# Patient Record
Sex: Male | Born: 1960
Health system: Southern US, Community
[De-identification: ages and names within clinical notes are randomized; demographics above are authoritative.]

## PROBLEM LIST (undated history)

## (undated) DIAGNOSIS — M1990 Unspecified osteoarthritis, unspecified site: Secondary | ICD-10-CM

## (undated) DIAGNOSIS — I10 Essential (primary) hypertension: Secondary | ICD-10-CM

## (undated) DIAGNOSIS — I739 Peripheral vascular disease, unspecified: Secondary | ICD-10-CM

## (undated) HISTORY — PX: MOUTH SURGERY: SHX715

## (undated) HISTORY — DX: Unspecified osteoarthritis, unspecified site: M19.90

---

## 2004-04-30 ENCOUNTER — Emergency Department: Payer: Self-pay | Admitting: Emergency Medicine

## 2008-05-17 ENCOUNTER — Emergency Department: Payer: Self-pay | Admitting: Emergency Medicine

## 2009-10-07 ENCOUNTER — Emergency Department: Payer: Self-pay | Admitting: Emergency Medicine

## 2016-08-08 DIAGNOSIS — F17200 Nicotine dependence, unspecified, uncomplicated: Secondary | ICD-10-CM | POA: Insufficient documentation

## 2016-08-08 DIAGNOSIS — Z0000 Encounter for general adult medical examination without abnormal findings: Secondary | ICD-10-CM | POA: Insufficient documentation

## 2016-10-08 ENCOUNTER — Ambulatory Visit (INDEPENDENT_AMBULATORY_CARE_PROVIDER_SITE_OTHER): Payer: BLUE CROSS/BLUE SHIELD | Admitting: Vascular Surgery

## 2016-10-08 ENCOUNTER — Encounter (INDEPENDENT_AMBULATORY_CARE_PROVIDER_SITE_OTHER): Payer: Self-pay | Admitting: Vascular Surgery

## 2016-10-08 VITALS — BP 138/80 | HR 71 | Resp 17 | Ht 67.0 in | Wt 127.2 lb

## 2016-10-08 DIAGNOSIS — F17200 Nicotine dependence, unspecified, uncomplicated: Secondary | ICD-10-CM

## 2016-10-08 DIAGNOSIS — I739 Peripheral vascular disease, unspecified: Secondary | ICD-10-CM | POA: Insufficient documentation

## 2016-10-08 DIAGNOSIS — M79604 Pain in right leg: Secondary | ICD-10-CM | POA: Diagnosis not present

## 2016-10-08 DIAGNOSIS — M79605 Pain in left leg: Secondary | ICD-10-CM | POA: Diagnosis not present

## 2016-10-08 NOTE — Progress Notes (Signed)
Subjective:    Patient ID: Kyle Gordon, male    DOB: Jun 05, 1960, 56 y.o.   MRN: [id number] Chief Complaint  Patient presents with  . Claudication   Presents at the request of Dr. Orland Jarredroxler for bilateral lower extremity claudication. Patient is experiencing cramping to his bilateral calf after long periods of activity. Patient states an interval shortening and his claudication distance. States most recently he has begun to experience rest pain which wakes him up at night. He denies any wound development to the lower extremity. Denies any fever, nausea or vomiting.    Review of Systems  Constitutional: Negative.   HENT: Negative.   Eyes: Negative.   Respiratory: Negative.   Cardiovascular:       Bilateral claudication  Gastrointestinal: Negative.   Endocrine: Negative.   Genitourinary: Negative.   Musculoskeletal: Negative.   Skin: Negative.   Allergic/Immunologic: Negative.   Neurological: Negative.   Hematological: Negative.   Psychiatric/Behavioral: Negative.       Objective:   Physical Exam  Constitutional: He is oriented to person, place, and time. He appears well-developed and well-nourished. No distress.  HENT:  Head: Normocephalic and atraumatic.  Right Ear: External ear normal.  Left Ear: External ear normal.  Eyes: Pupils are equal, round, and reactive to light. Conjunctivae are normal.  Neck: Normal range of motion.  Cardiovascular: Normal rate, regular rhythm, normal heart sounds and intact distal pulses.   Pulses:      Radial pulses are 2+ on the right side, and 2+ on the left side.       Dorsalis pedis pulses are 1+ on the right side, and 1+ on the left side.       Posterior tibial pulses are 1+ on the right side, and 1+ on the left side.  Pulmonary/Chest: Effort normal.  Musculoskeletal: Normal range of motion.  Neurological: He is alert and oriented to person, place, and time.  Skin: Skin is warm and dry. He is not diaphoretic.  Psychiatric: He has a  normal mood and affect. His behavior is normal. Judgment and thought content normal.  Vitals reviewed.  BP 138/80   Pulse 71   Resp 17   Ht 5\' 7"  (1.702 m)   Wt 127 lb 3.2 oz (57.7 kg)   BMI 19.92 kg/m   History reviewed. No pertinent past medical history.  Social History   Social History  . Marital status: Married    Spouse name: N/A  . Number of children: N/A  . Years of education: N/A   Occupational History  . Not on file.   Social History Main Topics  . Smoking status: Current Every Day Smoker  . Smokeless tobacco: Never Used  . Alcohol use Yes  . Drug use: Yes    Types: Marijuana  . Sexual activity: Not on file   Other Topics Concern  . Not on file   Social History Narrative  . No narrative on file    History reviewed. No pertinent surgical history.  Family History  Problem Relation Age of Onset  . Heart attack Father     No Known Allergies     Assessment & Plan:  Presents at the request of Dr. Orland Jarredroxler for bilateral lower extremity claudication. Patient is experiencing cramping to his bilateral calf after long periods of activity. Patient states an interval shortening and his claudication distance. States most recently he has begun to experience rest pain which wakes him up at night. He denies any wound development to  the lower extremity. Denies any fever, nausea or vomiting.   1. Bilateral lower extremity pain - New Patient presents with what sounds like claudication We'll bring patient back and obtain a stat ABI to rule out any peripheral artery disease that may be contributing to his discomfort  - VAS [place] ABI WITH/WO TBI; Future  2. Claudication of both lower extremities (HCC) - new As above - VAS [place] ABI WITH/WO TBI; Future  3. Tobacco use disorder - stable We had a discussion for approximately 10 minutes regarding the absolute need for smoking cessation due to the deleterious nature of tobacco on the vascular system. We discussed the tobacco use  would diminish patency of any intervention, and likely significantly worsen progressio of disease. We discussed multiple agents for quitting including replacement therapy or medications to reduce cravings such as Chantix. The patient voices their understanding of the importance of smoking cessation.  No current outpatient prescriptions on file prior to visit.   No current facility-administered medications on file prior to visit.     There are no Patient Instructions on file for this visit. No Follow-up on file.   Kazuma Elena A Datron Brakebill, PA-C

## 2016-10-17 ENCOUNTER — Encounter (INDEPENDENT_AMBULATORY_CARE_PROVIDER_SITE_OTHER): Payer: BLUE CROSS/BLUE SHIELD

## 2016-10-17 ENCOUNTER — Ambulatory Visit (INDEPENDENT_AMBULATORY_CARE_PROVIDER_SITE_OTHER): Payer: BLUE CROSS/BLUE SHIELD | Admitting: Vascular Surgery

## 2017-09-18 ENCOUNTER — Emergency Department
Admission: EM | Admit: 2017-09-18 | Discharge: 2017-09-18 | Disposition: A | Discharge status: Home / Self Care | Payer: Self-pay | Attending: Emergency Medicine | Admitting: Emergency Medicine

## 2017-09-18 ENCOUNTER — Emergency Department: Payer: Self-pay

## 2017-09-18 ENCOUNTER — Other Ambulatory Visit: Payer: Self-pay

## 2017-09-18 DIAGNOSIS — X509XXA Other and unspecified overexertion or strenuous movements or postures, initial encounter: Secondary | ICD-10-CM | POA: Insufficient documentation

## 2017-09-18 DIAGNOSIS — Y939 Activity, unspecified: Secondary | ICD-10-CM | POA: Insufficient documentation

## 2017-09-18 DIAGNOSIS — M79671 Pain in right foot: Secondary | ICD-10-CM | POA: Insufficient documentation

## 2017-09-18 DIAGNOSIS — Y999 Unspecified external cause status: Secondary | ICD-10-CM | POA: Insufficient documentation

## 2017-09-18 DIAGNOSIS — Y929 Unspecified place or not applicable: Secondary | ICD-10-CM | POA: Insufficient documentation

## 2017-09-18 DIAGNOSIS — F17210 Nicotine dependence, cigarettes, uncomplicated: Secondary | ICD-10-CM | POA: Insufficient documentation

## 2017-09-18 LAB — GLUCOSE, CAPILLARY: GLUCOSE-CAPILLARY: 98 mg/dL (ref 70–99)

## 2017-09-18 NOTE — ED Notes (Signed)
See triage note  States he is having pain to right foot  States pain is mainly across the tip of toes  And also states foot has been swelling Denies any injury

## 2017-09-18 NOTE — ED Provider Notes (Signed)
[facility] Emergency Department Provider Note  ____________________________________________  Time seen: Approximately 4:33 PM  I have reviewed the triage vital signs and the nursing notes.   HISTORY  Chief Complaint Foot Pain    HPI Kyle Gordon is a 57 y.o. male that presents to the emergency department for evaluation of right foot pain for 1 week. Pain is primarily across his toes. Top of his foot feels swollen.  He has not noticed any changing in colors.  He saw primary care last month for lower leg pain.  He was seeing vascular surgery for lower leg pain last year but has not followed up.  No trauma.  No numbness or tingling.   History reviewed. No pertinent past medical history.  Patient Active Problem List   Diagnosis Date Noted  . Bilateral lower extremity pain 10/08/2016  . Claudication of both lower extremities (HCC) 10/08/2016  . Tobacco use disorder 10/08/2016    History reviewed. No pertinent surgical history.  Prior to Admission medications   Medication Sig Start Date End Date Taking? Authorizing Provider  celecoxib (CELEBREX) 100 MG capsule Take 100 mg by mouth 2 (two) times daily.   Yes [provider]    Allergies Patient has no known allergies.  Family History  Problem Relation Age of Onset  . Heart attack Father     Social History Social History   Tobacco Use  . Smoking status: Current Every Day Smoker  . Smokeless tobacco: Never Used  Substance Use Topics  . Alcohol use: Yes  . Drug use: Yes    Types: Marijuana     Review of Systems  Constitutional: No fever/chills Musculoskeletal: Positive for foot pain. Skin: Negative for rash, abrasions, lacerations, ecchymosis. Neurological: Negative for numbness or tingling   ____________________________________________   PHYSICAL EXAM:  VITAL SIGNS: ED Triage Vitals  Enc Vitals Group     BP 09/18/17 1534 (!) 157/84     Pulse Rate 09/18/17 1534 81      Resp 09/18/17 1534 18     Temp 09/18/17 1534 98.5 F (36.9 C)     Temp Source 09/18/17 1534 Oral     SpO2 09/18/17 1534 99 %     Weight 09/18/17 1535 130 lb (59 kg)     Height 09/18/17 1535 5\' 7"  (1.702 m)     Head Circumference --      Peak Flow --      Pain Score 09/18/17 1534 8     Pain Loc --      Pain Edu? --      Excl. in GC? --      Constitutional: Alert and oriented. Well appearing and in no acute distress. Eyes: Conjunctivae are normal. PERRL. EOMI. Head: Atraumatic. ENT:      Ears:      Nose: No congestion/rhinnorhea.      Mouth/Throat: Mucous membranes are moist.  Neck: No stridor.   Cardiovascular: Normal rate, regular rhythm.  Good peripheral circulation.  Palpable dorsalis pedis pulses. Respiratory: Normal respiratory effort without tachypnea or retractions. Lungs CTAB. Good air entry to the bases with no decreased or absent breath sounds. Musculoskeletal: Full range of motion to all extremities. No gross deformities appreciated.  Mild dorsal foot swelling.  No erythema or ecchymosis.  No calf tenderness.  Tenderness to palpation of toes and dorsal foot.  Neurologic:  Normal speech and language. No gross focal neurologic deficits are appreciated.  Skin:  Skin is warm, dry and intact. No rash  noted. Psychiatric: Mood and affect are normal. Speech and behavior are normal. Patient exhibits appropriate insight and judgement.   ____________________________________________   LABS (all labs ordered are listed, but only abnormal results are displayed)  Labs Reviewed  GLUCOSE, CAPILLARY  CBG MONITORING, ED   ____________________________________________  EKG   ____________________________________________  RADIOLOGY Lexine BatonI, Malay Fantroy, personally viewed and evaluated these images (plain radiographs) as part of my medical decision making, as well as reviewing the written report by the radiologist.  [place] Venous Img Lower Unilateral Right  Result Date:  09/18/2017 CLINICAL DATA:  Right foot swelling and edema EXAM: RIGHT LOWER EXTREMITY VENOUS DOPPLER ULTRASOUND TECHNIQUE: Gray-scale sonography with graded compression, as well as color Doppler and duplex ultrasound were performed to evaluate the lower extremity deep venous systems from the level of the common femoral vein and including the common femoral, femoral, profunda femoral, popliteal and calf veins including the posterior tibial, peroneal and gastrocnemius veins when visible. The superficial great saphenous vein was also interrogated. Spectral Doppler was utilized to evaluate flow at rest and with distal augmentation maneuvers in the common femoral, femoral and popliteal veins. COMPARISON:  None. FINDINGS: Contralateral Common Femoral Vein: Respiratory phasicity is normal and symmetric with the symptomatic side. No evidence of thrombus. Normal compressibility. Common Femoral Vein: No evidence of thrombus. Normal compressibility, respiratory phasicity and response to augmentation. Saphenofemoral Junction: No evidence of thrombus. Normal compressibility and flow on color Doppler imaging. Profunda Femoral Vein: No evidence of thrombus. Normal compressibility and flow on color Doppler imaging. Femoral Vein: No evidence of thrombus. Normal compressibility, respiratory phasicity and response to augmentation. Popliteal Vein: No evidence of thrombus. Normal compressibility, respiratory phasicity and response to augmentation. Calf Veins: No evidence of thrombus. Normal compressibility and flow on color Doppler imaging. Superficial Great Saphenous Vein: No evidence of thrombus. Normal compressibility. Venous Reflux:  None. Other Findings:  None. IMPRESSION: No evidence of deep venous thrombosis. Electronically Signed   By: Judie PetitM.  Shick M.D.   On: 09/18/2017 17:02   Dg Foot Complete Right  Result Date: 09/18/2017 CLINICAL DATA:  Pain for 1 week EXAM: RIGHT FOOT COMPLETE - 3+ VIEW COMPARISON:  None. FINDINGS: Frontal,  oblique, and lateral views obtained. On the oblique view, there is a linear lucency which parallels the cortex in the proximal to mid portions of the first metatarsal. This appearance raises concern for incomplete fracture in this area. No other potential fracture evident. No dislocation. Joint spaces appear normal. No erosive change. IMPRESSION: Question incomplete fracture proximal to mid portions of the first metatarsal, seen only on the oblique view. Clinical assessment of this finding advised. No other findings elsewhere to suggest potential fracture. No dislocation. No appreciable arthropathic change. Electronically Signed   By: Bretta BangWilliam  Woodruff III M.D.   On: 09/18/2017 16:03    ____________________________________________    PROCEDURES  Procedure(s) performed:    Procedures    Medications - No data to display   ____________________________________________   INITIAL IMPRESSION / ASSESSMENT AND PLAN / ED COURSE  Pertinent labs & imaging results that were available during my care of the patient were reviewed by me and considered in my medical decision making (see chart for details).  Review of the [place] CSRS was performed in accordance of the [facility] prior to dispensing any controlled drugs.     Patient presented to the emergency department for evaluation of right foot pain for 1 week..  Vital signs and exam are reassuring.  X-ray concerning for possible fracture but patient does  not have any pain over this location.  Ultrasound negative for DVT.  Patient has palpable pulses.  He was seeing vascular surgery for lower extremity pain and claudication but has not followed up with them.   He was given a prescription for Celebrex from his primary care 1 month ago but has lost his insurance and cannot see them again. [facility].   Patient is to follow up with PCP and vascular surgery as directed. Patient is given ED precautions to return to the  ED for any worsening or new symptoms.     ____________________________________________  FINAL CLINICAL IMPRESSION(S) / ED DIAGNOSES  Final diagnoses:  Right foot pain      NEW MEDICATIONS STARTED DURING THIS VISIT:  ED Discharge Orders    None          This chart was dictated using voice recognition software/Dragon. Despite best efforts to proofread, errors can occur which can change the meaning. Any change was purely unintentional.    Enid DerryWagner, Clema Skousen, PA-C 09/18/17 1907    Minna AntisPaduchowski, Kevin, MD 09/18/17 2019

## 2017-09-18 NOTE — ED Triage Notes (Addendum)
Right foot pain X 1 week. No injury. Pt cannot elevate foot due to pain. Intermittent sharp and shooting pains through right foot.   Mild swelling to top or right foot starting at toes. No signs of infection or wound

## 2017-09-18 NOTE — Discharge Instructions (Addendum)
Please return to the emergency department if your pain gets worse, your foot is changing colors, or you're loosing feeling. [facility]. Follow up with vascular surgery for further testing.

## 2017-10-01 ENCOUNTER — Ambulatory Visit (INDEPENDENT_AMBULATORY_CARE_PROVIDER_SITE_OTHER): Payer: Self-pay | Admitting: Nurse Practitioner

## 2017-10-01 ENCOUNTER — Encounter (INDEPENDENT_AMBULATORY_CARE_PROVIDER_SITE_OTHER): Payer: Self-pay | Admitting: Nurse Practitioner

## 2017-10-01 ENCOUNTER — Encounter (INDEPENDENT_AMBULATORY_CARE_PROVIDER_SITE_OTHER): Payer: Self-pay

## 2017-10-01 VITALS — BP 184/98 | HR 84 | Resp 16 | Ht 67.0 in | Wt 123.6 lb

## 2017-10-01 DIAGNOSIS — I739 Peripheral vascular disease, unspecified: Secondary | ICD-10-CM

## 2017-10-01 DIAGNOSIS — F17200 Nicotine dependence, unspecified, uncomplicated: Secondary | ICD-10-CM

## 2017-10-01 DIAGNOSIS — L97909 Non-pressure chronic ulcer of unspecified part of unspecified lower leg with unspecified severity: Secondary | ICD-10-CM

## 2017-10-01 MED ORDER — TRAMADOL HCL 50 MG PO TABS: 50.0000 mg | ORAL_TABLET | Freq: Four times a day (QID) | ORAL | 0 refills | Status: DC | PRN

## 2017-10-01 NOTE — Progress Notes (Signed)
Subjective:    Patient ID: Kyle Gordon, male    DOB: [birth year], 57 y.o.   MRN: [id number] Chief Complaint  Patient presents with  . Follow-up    right foot pain    HPI  Kyle Gordon is a 57 y.o. male who presents with pain and swelling of his right lower extremity.  The patient has had claudication-like symptoms for over a year, when he was last seen in our office.  However, the patient was unable to follow-up and obtain ABIs.  The patient today presents with pain in his right lower extremity with ulceration of his second digit.  The patient has a 1 cm blister with intact skin.  The patient also describes rest pain symptoms, which consist of extreme pain while lying flat in bed with hanging of his foot off the bed to relieve the pain.  The patient states that his pain is consistent in his right lower extremity.  Patient states that he also has pain in his left lower extremity, however not to the same extent of the pain in his right lower extremity.  The patient presented to the emergency room on 09/18/2017, which he underwent a DVT study which was negative for DVTs.   Constitutional: [] Weight loss  [] Fever  [] Chills Cardiac: [] Chest pain   [] Chest pressure   [] Palpitations   [] Shortness of breath when laying flat   [] Shortness of breath with exertion. Vascular:  [x] Pain in legs with walking   [x] Pain in legs with standing  [] History of DVT   [] Phlebitis   [x] Swelling in legs   [] Varicose veins   [x] Non-healing ulcers Pulmonary:   [] Uses home oxygen   [] Productive cough   [] Hemoptysis   [] Wheeze  [] COPD   [] Asthma Neurologic:  [] Dizziness   [] Seizures   [] History of stroke   [] History of TIA  [] Aphasia   [] Vissual changes   [] Weakness or numbness in arm   [] Weakness or numbness in leg Musculoskeletal:   [] Joint swelling   [] Joint pain   [] Low back pain Hematologic:  [] Easy bruising  [] Easy bleeding   [] Hypercoagulable state   [] Anemic Gastrointestinal:  [] Diarrhea   [] Vomiting   [] Gastroesophageal reflux/heartburn   [] Difficulty swallowing. Genitourinary:  [] Chronic kidney disease   [] Difficult urination  [] Frequent urination   [] Blood in urine Skin:  [] Rashes   [] Ulcers  Psychological:  [] History of anxiety   []  History of major depression.     Objective:   Physical Exam  BP (!) 184/98 (BP Location: Right Arm)   Pulse 84   Resp 16   Ht 5\' 7"  (1.702 m)   Wt 123 lb 9.6 oz (56.1 kg)   BMI 19.36 kg/m   Past Medical History:  Diagnosis Date  . Arthritis      Gen: WD/WN, NAD Head: [place]/AT, No temporalis wasting.  Ear/Nose/Throat: Hearing grossly intact, nares w/o erythema or drainage Eyes: PER, EOMI, sclera nonicteric.  Neck: Supple, no masses.  No JVD.  Pulmonary:  Good air movement, no use of accessory muscles.  Cardiac: RRR Vascular:  1 cm ulceration on lateral edge of second digit Vessel Right Left  PT  not palpable   DP  not palpable   Gastrointestinal: soft, non-distended. No guarding/no peritoneal signs.  Musculoskeletal: M/S 5/5 throughout.  No deformity or atrophy.  Neurologic: Pain and light touch intact in extremities.  Symmetrical.  Speech is fluent. Motor exam as listed above. Psychiatric: Judgment intact, Mood & affect appropriate for pt's clinical situation. Dermatologic: No Venous  rashes. No Ulcers Noted.  No changes consistent with cellulitis. Lymph : No Cervical lymphadenopathy, no lichenification or skin changes of chronic lymphedema.   Social History   Socioeconomic History  . Marital status: Married    Spouse name: Not on file  . Number of children: Not on file  . Years of education: Not on file  . Highest education level: Not on file  Occupational History  . Not on file  Social Needs  . Financial resource strain: Not on file  . Food insecurity:    Worry: Not on file    Inability: Not on file  . Transportation needs:    Medical: Not on file    Non-medical: Not on file  Tobacco Use  . Smoking status: Current Every Day  Smoker  . Smokeless tobacco: Never Used  Substance and Sexual Activity  . Alcohol use: Yes  . Drug use: Yes    Types: Marijuana  . Sexual activity: Not on file  Lifestyle  . Physical activity:    Days per week: Not on file    Minutes per session: Not on file  . Stress: Not on file  Relationships  . Social connections:    Talks on phone: Not on file    Gets together: Not on file    Attends religious service: Not on file    Active member of club or organization: Not on file    Attends meetings of clubs or organizations: Not on file    Relationship status: Not on file  . Intimate partner violence:    Fear of current or ex partner: Not on file    Emotionally abused: Not on file    Physically abused: Not on file    Forced sexual activity: Not on file  Other Topics Concern  . Not on file  Social History Narrative  . Not on file    Past Surgical History:  Procedure Laterality Date  . MOUTH SURGERY      Family History  Problem Relation Age of Onset  . 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  This represents a limb threatening ischemia and places the patient at the risk for limb loss.  Patient should undergo angiography of the right lower extremities with the hope for intervention for limb salvage.  The risks and benefits as well as the alternative therapies was discussed in detail with the patient.  All questions were answered.  Patient agrees to proceed with angiography.  The patient will follow up with me in the office after the procedure.    2. Claudication of both lower extremities (HCC) The patient will follow-up in the office following his angiogram.  We will perform ABIs to determine if he will also need an  angiogram on his left lower extremity.  3. Tobacco use disorder The patient was counseled to consider smoking cessation.  Patient was counseled as to the pathophysiology of atherosclerosis and peripheral vascular disease and it was noted that tobacco use is a major contribution to these diseases. Current Outpatient Medications on File Prior to Visit  Medication Sig Dispense Refill  . celecoxib (CELEBREX) 100 MG capsule Take 100 mg by mouth 2 (two) times daily.     No current  facility-administered medications on file prior to visit.     There are no Patient Instructions on file for this visit. No follow-ups on file.   Georgiana Spinner, NP

## 2017-10-03 ENCOUNTER — Encounter (INDEPENDENT_AMBULATORY_CARE_PROVIDER_SITE_OTHER): Payer: Self-pay

## 2017-10-04 ENCOUNTER — Other Ambulatory Visit (INDEPENDENT_AMBULATORY_CARE_PROVIDER_SITE_OTHER): Payer: Self-pay | Admitting: Nurse Practitioner

## 2017-10-11 ENCOUNTER — Inpatient Hospital Stay: Admission: RE | Admit: 2017-10-11 | Payer: Self-pay | Source: Ambulatory Visit

## 2017-10-13 MED ORDER — DEXTROSE 5 % IV SOLN
2.0000 g | Freq: Once | INTRAVENOUS | Status: AC
  Administered 2017-10-14: 2 g via INTRAVENOUS
  Filled 2017-10-13: qty 20

## 2017-10-14 ENCOUNTER — Ambulatory Visit: Payer: Self-pay

## 2017-10-14 ENCOUNTER — Encounter: Payer: Self-pay | Admitting: Emergency Medicine

## 2017-10-14 ENCOUNTER — Ambulatory Visit
Admission: RE | Admit: 2017-10-14 | Discharge: 2017-10-14 | Disposition: A | Discharge status: Home / Self Care | Payer: Self-pay | Source: Ambulatory Visit | Attending: Vascular Surgery | Admitting: Vascular Surgery

## 2017-10-14 ENCOUNTER — Encounter
Admission: RE | Disposition: A | Discharge status: Home / Self Care | Payer: Self-pay | Source: Ambulatory Visit | Attending: Vascular Surgery

## 2017-10-14 DIAGNOSIS — I70235 Atherosclerosis of native arteries of right leg with ulceration of other part of foot: Secondary | ICD-10-CM | POA: Insufficient documentation

## 2017-10-14 DIAGNOSIS — L97519 Non-pressure chronic ulcer of other part of right foot with unspecified severity: Secondary | ICD-10-CM | POA: Insufficient documentation

## 2017-10-14 DIAGNOSIS — I70213 Atherosclerosis of native arteries of extremities with intermittent claudication, bilateral legs: Secondary | ICD-10-CM

## 2017-10-14 DIAGNOSIS — L97909 Non-pressure chronic ulcer of unspecified part of unspecified lower leg with unspecified severity: Secondary | ICD-10-CM

## 2017-10-14 DIAGNOSIS — I70299 Other atherosclerosis of native arteries of extremities, unspecified extremity: Secondary | ICD-10-CM

## 2017-10-14 DIAGNOSIS — F17210 Nicotine dependence, cigarettes, uncomplicated: Secondary | ICD-10-CM | POA: Insufficient documentation

## 2017-10-14 DIAGNOSIS — I70212 Atherosclerosis of native arteries of extremities with intermittent claudication, left leg: Secondary | ICD-10-CM | POA: Insufficient documentation

## 2017-10-14 HISTORY — PX: LOWER EXTREMITY ANGIOGRAPHY: CATH118251

## 2017-10-14 LAB — CREATININE, SERUM
Creatinine, Ser: 0.8 mg/dL (ref 0.61–1.24)
GFR calc Af Amer: >60 mL/min (ref >60)

## 2017-10-14 LAB — BUN: BUN: 6 mg/dL (ref 6–20)

## 2017-10-14 SURGERY — LOWER EXTREMITY ANGIOGRAPHY
Anesthesia: Moderate Sedation | Laterality: Right

## 2017-10-14 MED ORDER — IOPAMIDOL (ISOVUE-300) INJECTION 61%
INTRAVENOUS | Status: DC | PRN
  Administered 2017-10-14: 30 mL via INTRAVENOUS

## 2017-10-14 MED ORDER — LIDOCAINE-EPINEPHRINE (PF) 1 %-1:200000 IJ SOLN
INTRAMUSCULAR | Status: AC
  Filled 2017-10-14: qty 30

## 2017-10-14 MED ORDER — HYDROMORPHONE HCL 1 MG/ML IJ SOLN
1.0000 mg | Freq: Once | INTRAMUSCULAR | Status: AC | PRN
  Administered 2017-10-14: 1 mg via INTRAVENOUS

## 2017-10-14 MED ORDER — SODIUM CHLORIDE 0.9 % IV SOLN: INTRAVENOUS | Status: AC

## 2017-10-14 MED ORDER — HYDRALAZINE HCL 20 MG/ML IJ SOLN: 5.0000 mg | INTRAMUSCULAR | Status: DC | PRN

## 2017-10-14 MED ORDER — HEPARIN (PORCINE) IN NACL 1000-0.9 UT/500ML-% IV SOLN
INTRAVENOUS | Status: AC
  Filled 2017-10-14: qty 1000

## 2017-10-14 MED ORDER — SODIUM CHLORIDE 0.9 % IV SOLN
INTRAVENOUS | Status: DC
  Administered 2017-10-14: 08:00:00 via INTRAVENOUS

## 2017-10-14 MED ORDER — LABETALOL HCL 5 MG/ML IV SOLN
INTRAVENOUS | Status: AC
  Filled 2017-10-14: qty 4

## 2017-10-14 MED ORDER — ONDANSETRON HCL 4 MG/2ML IJ SOLN: 4.0000 mg | Freq: Four times a day (QID) | INTRAMUSCULAR | Status: DC | PRN

## 2017-10-14 MED ORDER — SODIUM CHLORIDE 0.9% FLUSH: 3.0000 mL | INTRAVENOUS | Status: DC | PRN

## 2017-10-14 MED ORDER — HYDROMORPHONE HCL 1 MG/ML IJ SOLN
INTRAMUSCULAR | Status: AC
  Filled 2017-10-14: qty 1

## 2017-10-14 MED ORDER — MIDAZOLAM HCL 2 MG/2ML IJ SOLN
INTRAMUSCULAR | Status: DC | PRN
  Administered 2017-10-14: 2 mg via INTRAVENOUS

## 2017-10-14 MED ORDER — FENTANYL CITRATE (PF) 100 MCG/2ML IJ SOLN
INTRAMUSCULAR | Status: AC
  Filled 2017-10-14: qty 2

## 2017-10-14 MED ORDER — MIDAZOLAM HCL 5 MG/5ML IJ SOLN
INTRAMUSCULAR | Status: AC
  Filled 2017-10-14: qty 5

## 2017-10-14 MED ORDER — SODIUM CHLORIDE 0.9% FLUSH: 3.0000 mL | Freq: Two times a day (BID) | INTRAVENOUS | Status: DC

## 2017-10-14 MED ORDER — ACETAMINOPHEN 325 MG PO TABS: 650.0000 mg | ORAL_TABLET | ORAL | Status: DC | PRN

## 2017-10-14 MED ORDER — FENTANYL CITRATE (PF) 100 MCG/2ML IJ SOLN
INTRAMUSCULAR | Status: DC | PRN
  Administered 2017-10-14: 50 ug via INTRAVENOUS

## 2017-10-14 MED ORDER — SODIUM CHLORIDE 0.9 % IV SOLN: 250.0000 mL | INTRAVENOUS | Status: DC | PRN

## 2017-10-14 MED ORDER — HEPARIN SODIUM (PORCINE) 1000 UNIT/ML IJ SOLN
INTRAMUSCULAR | Status: AC
  Filled 2017-10-14: qty 1

## 2017-10-14 MED ORDER — LABETALOL HCL 5 MG/ML IV SOLN
10.0000 mg | INTRAVENOUS | Status: DC | PRN
  Administered 2017-10-14 (×2): 10 mg via INTRAVENOUS

## 2017-10-14 MED ORDER — IOPAMIDOL (ISOVUE-370) INJECTION 76%
85.0000 mL | Freq: Once | INTRAVENOUS | Status: AC | PRN
  Administered 2017-10-14: 85 mL via INTRAVENOUS

## 2017-10-14 SURGICAL SUPPLY — 8 items
CANNULA 5F STIFF (CANNULA) ×6 IMPLANT
CATH PIG 70CM (CATHETERS) ×3 IMPLANT
COVER PROBE U/S 5X48 (MISCELLANEOUS) ×3 IMPLANT
PACK ANGIOGRAPHY (CUSTOM PROCEDURE TRAY) ×3 IMPLANT
SHEATH BRITE TIP 5FRX11 (SHEATH) ×3 IMPLANT
TOWEL OR 17X26 4PK STRL BLUE (TOWEL DISPOSABLE) ×3 IMPLANT
TUBING CONTRAST HIGH PRESS 72 (TUBING) ×3 IMPLANT
WIRE J 3MM .035X145CM (WIRE) ×3 IMPLANT

## 2017-10-14 NOTE — Discharge Instructions (Signed)
Femoral Site Care °Refer to this sheet in the next few weeks. These instructions provide you with information about caring for yourself after your procedure. Your health care provider may also give you more specific instructions. Your treatment has been planned according to current medical practices, but problems sometimes occur. Call your health care provider if you have any problems or questions after your procedure. °What can I expect after the procedure? °After your procedure, it is typical to have the following: °· Bruising at the site that usually fades within 1-2 weeks. °· Blood collecting in the tissue (hematoma) that may be painful to the touch. It should usually decrease in size and tenderness within 1-2 weeks. ° °Follow these instructions at home: °· Take medicines only as directed by your health care provider. °· You may shower 24-48 hours after the procedure or as directed by your health care provider. Remove the bandage (dressing) and gently wash the site with plain soap and water. Pat the area dry with a clean towel. Do not rub the site, because this may cause bleeding. °· Do not take baths, swim, or use a hot tub until your health care provider approves. °· Check your insertion site every day for redness, swelling, or drainage. °· Do not apply powder or lotion to the site. °· Limit use of stairs to twice a day for the first 2-3 days or as directed by your health care provider. °· Do not squat for the first 2-3 days or as directed by your health care provider. °· Do not lift over 10 lb (4.5 kg) for 5 days after your procedure or as directed by your health care provider. °· Ask your health care provider when it is okay to: °? Return to work or school. °? Resume usual physical activities or sports. °? Resume sexual activity. °· Do not drive home if you are discharged the same day as the procedure. Have someone else drive you. °· You may drive 24 hours after the procedure unless otherwise instructed by  your health care provider. °· Do not operate machinery or power tools for 24 hours after the procedure or as directed by your health care provider. °· If your procedure was done as an outpatient procedure, which means that you went home the same day as your procedure, a responsible adult should be with you for the first 24 hours after you arrive home. °· Keep all follow-up visits as directed by your health care provider. This is important. °Contact a health care provider if: °· You have a fever. °· You have chills. °· You have increased bleeding from the site. Hold pressure on the site. °Get help right away if: °· You have unusual pain at the site. °· You have redness, warmth, or swelling at the site. °· You have drainage (other than a small amount of blood on the dressing) from the site. °· The site is bleeding, and the bleeding does not stop after 30 minutes of holding steady pressure on the site. °· Your leg or foot becomes pale, cool, tingly, or numb. °This information is not intended to replace advice given to you by your health care provider. Make sure you discuss any questions you have with your health care provider. °Document Released: 09/18/2013 Document Revised: 06/23/2015 Document Reviewed: 08/04/2013 °Elsevier Interactive Patient Education © 2018 [employer] °Moderate Conscious Sedation, Adult, Care After °These instructions provide you with information about caring for yourself after your procedure. Your health care provider may also give you more   specific instructions. Your treatment has been planned according to current medical practices, but problems sometimes occur. Call your health care provider if you have any problems or questions after your procedure. °What can I expect after the procedure? °After your procedure, it is common: °· To feel sleepy for several hours. °· To feel clumsy and have poor balance for several hours. °· To have poor judgment for several hours. °· To vomit if you eat too  soon. ° °Follow these instructions at home: °For at least 24 hours after the procedure: ° °· Do not: °? Participate in activities where you could fall or become injured. °? Drive. °? Use heavy machinery. °? Drink alcohol. °? Take sleeping pills or medicines that cause drowsiness. °? Make important decisions or sign legal documents. °? Take care of children on your own. °· Rest. °Eating and drinking °· Follow the diet recommended by your health care provider. °· If you vomit: °? Drink water, juice, or soup when you can drink without vomiting. °? Make sure you have little or no nausea before eating solid foods. °General instructions °· Have a responsible adult stay with you until you are awake and alert. °· Take over-the-counter and prescription medicines only as told by your health care provider. °· If you smoke, do not smoke without supervision. °· Keep all follow-up visits as told by your health care provider. This is important. °Contact a health care provider if: °· You keep feeling nauseous or you keep vomiting. °· You feel light-headed. °· You develop a rash. °· You have a fever. °Get help right away if: °· You have trouble breathing. °This information is not intended to replace advice given to you by your health care provider. Make sure you discuss any questions you have with your health care provider. °Document Released: 11/05/2012 Document Revised: 06/20/2015 Document Reviewed: 05/07/2015 °Elsevier Interactive Patient Education © 2018 [employer] ° °

## 2017-10-14 NOTE — H&P (Signed)
[facility] History & Physical Update  The patient was interviewed and re-examined.  The patient's previous History and Physical has been reviewed and is unchanged.  There is no change in the plan of care. We plan to proceed with the scheduled procedure.  Festus BarrenJason Dew, MD  10/14/2017, 8:06 AM

## 2017-10-14 NOTE — Op Note (Signed)
[facility]  Percutaneous Study/Intervention Procedural Note   Date of Surgery: 10/14/2017  Surgeon(s):DEW,JASON    Assistants:none  Pre-operative Diagnosis: PAD with ulceration right lower extremity, pain bilateral lower extremities  Post-operative diagnosis:  Same  Procedure(s) Performed:             1.  Ultrasound guidance for vascular access bilateral femoral arteries             2.   Imaging through the femoral micropuncture sheaths bilaterally to evaluate the aortoiliac segment and bilateral lower extremity angiograms  EBL: 10 cc  Contrast: 30 cc  Fluoro Time: 0.6 minutes  Moderate Conscious Sedation Time: approximately 15 minutes using 2 mg of Versed and 50 Mcg of Fentanyl              Indications:  Patient is a 57 y.o.male with nonhealing ulceration on the right foot and significant pain bilaterally with no palpable pedal pulses. The patient is brought in for angiography for further evaluation and potential treatment.  Due to the limb threatening nature of the situation, angiogram was performed for attempted limb salvage. The patient is aware that if the procedure fails, amputation would be expected.  The patient also understands that even with successful revascularization, amputation may still be required due to the severity of the situation. Risks and benefits are discussed and informed consent is obtained.   Procedure:  The patient was identified and appropriate procedural time out was performed.  The patient was then placed supine on the table and prepped and draped in the usual sterile fashion. Moderate conscious sedation was administered during a face to face encounter with the patient throughout the procedure with my supervision of the RN administering medicines and monitoring the patient's vital signs, pulse oximetry, telemetry and mental status throughout from the start of the procedure until the patient was taken to the recovery room. Ultrasound  was used to evaluate the left common femoral artery.  It was patent but diseased.  A digital ultrasound image was acquired.  A Seldinger needle was used to access the left common femoral artery under direct ultrasound guidance and a permanent image was performed.   The flow was extremely sluggish and the J-wire would not pass.  A micropuncture wire went to the mid iliacs and I then placed a micropuncture sheath in the left femoral artery.  Imaging showed occlusion of the iliac vessels from the external iliac up.  The aorta was highly calcific but no flow could be demonstrated into the aorta through this injection.  At this point, I went ahead and access the right femoral artery to try to see what the anatomy was there as well.  Again, a calcific and diseased right common femoral artery was visualized with ultrasound.  It was accessed with a micropuncture needle and a micropuncture wire and sheath were placed but this would not pass up to its tip.  Imaging through the right femoral sheath showed the right iliac system to be occluded as well with the external iliac artery being occluded down to the mid to distal segment.  Again, no flow could be identified in the aorta.  I elected to go ahead and image both lower extremities through the femoral sheaths to understand the infrainguinal disease as well and determine what further reconstruction would be performed.  The left leg was imaged to the left femoral sheath.  This demonstrated calcified disease of the common femoral artery with a flush SFA occlusion.  Again,  there appeared to be some calcific disease in the profunda femoris artery but no high-grade stenosis.  The SFA reconstituted the popliteal artery around the knee.  Again, there was some tibial disease that was difficult to opacify due to the poor inflow.  There appeared to be an anterior tibial artery with some degree of stenosis or occlusion although this was the best runoff distally.  A peroneal artery  appeared to be present as well.  I then image the right leg through the right micropuncture sheath.  This demonstrated calcified disease of the common femoral artery with a flush SFA occlusion.  The profunda femoris artery was calcified but did not appear highly stenotic.  The SFA reconstituted the above-knee popliteal artery.  There was then what appeared to be two-vessel runoff but disease and near occlusive stenosis in the anterior tibial artery which was 1 of the vessels.  The posterior tibial artery also appeared to be somewhat diseased but it was difficult to discern the degree of stenosis due to the poor inflow.  Both sheaths were removed and pressure was held in the groin bilaterally.  The patient was taken to the recovery room in stable condition having tolerated the procedure well.  A CT scan of the abdomen and pelvis will be performed to further evaluate his aorta and determine reconstruction options.  Findings:               Aortogram:  Occlusion of the iliac arteries precluded good evaluation of the aorta and renal arteries although a suspected occlusion of the aorta was present             Right lower Extremity:  Calcified disease of the common femoral artery with a flush SFA occlusion.  The profunda femoris artery was calcified but did not appear highly stenotic.  The SFA reconstituted the above-knee popliteal artery.  There was then what appeared to be two-vessel runoff but disease and near occlusive stenosis in the anterior tibial artery which was 1 of the vessels.  The posterior tibial artery also appeared to be somewhat diseased but it was difficult to discern the degree of stenosis due to the poor inflow  Left Lower Extremity: Calcified disease of the common femoral artery with a flush SFA occlusion.  Again, there appeared to be some calcific disease in the profunda femoris artery but no high-grade stenosis.  The SFA reconstituted the popliteal artery around the knee.  Again, there was some  tibial disease that was difficult to opacify due to the poor inflow.  There appeared to be an anterior tibial artery with some degree of stenosis or occlusion although this was the best runoff distally.  A peroneal artery appeared to be present as well   Disposition: Patient was taken to the recovery room in stable condition having tolerated the procedure well.  Complications: None  Festus Barren 10/14/2017 9:22 AM   This note was created with Dragon Medical transcription system. Any errors in dictation are purely unintentional.

## 2017-10-15 ENCOUNTER — Encounter (INDEPENDENT_AMBULATORY_CARE_PROVIDER_SITE_OTHER): Payer: Self-pay

## 2017-10-29 ENCOUNTER — Encounter (INDEPENDENT_AMBULATORY_CARE_PROVIDER_SITE_OTHER): Payer: Self-pay | Admitting: Vascular Surgery

## 2017-10-29 ENCOUNTER — Ambulatory Visit (INDEPENDENT_AMBULATORY_CARE_PROVIDER_SITE_OTHER): Payer: Self-pay | Admitting: Vascular Surgery

## 2017-10-29 VITALS — BP 185/96 | HR 88 | Resp 17 | Ht 67.0 in | Wt 119.6 lb

## 2017-10-29 DIAGNOSIS — L97519 Non-pressure chronic ulcer of other part of right foot with unspecified severity: Secondary | ICD-10-CM

## 2017-10-29 DIAGNOSIS — I7025 Atherosclerosis of native arteries of other extremities with ulceration: Secondary | ICD-10-CM

## 2017-10-29 DIAGNOSIS — R030 Elevated blood-pressure reading, without diagnosis of hypertension: Secondary | ICD-10-CM | POA: Insufficient documentation

## 2017-10-29 DIAGNOSIS — F17200 Nicotine dependence, unspecified, uncomplicated: Secondary | ICD-10-CM

## 2017-10-29 DIAGNOSIS — F17210 Nicotine dependence, cigarettes, uncomplicated: Secondary | ICD-10-CM

## 2017-10-29 NOTE — Patient Instructions (Signed)

## 2017-10-29 NOTE — Assessment & Plan Note (Signed)
The patient has a critical and limb threatening situation.  He has rest pain bilaterally and an ulceration on the right foot. He has subsequently undergone a CT angiogram of the abdomen and pelvis which I have independently reviewed.  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  The iliacs are occluded and reconstituted in the proximal common femoral arteries which were also diseased.  He has SFA occlusions bilaterally. This is a very severe and worrisome problem.  He is going to need a major arterial reconstruction in the form of an aortobifemoral bypass likely with a concomitant aortic endarterectomy as he has significant plaque all the way up to the renal arteries.  Bilateral femoral endarterectomies are also going to be required as he has femoral disease as well.  On the right, with ulceration and an SFA occlusion we could either add a femoral to popliteal bypass or try to recanalize his SFA with stents which would be a less invasive option on an already major long operation.  I think that would be a better option.  I have discussed the grave nature of the situation with the patient.  I discussed that limb loss is still certainly a possibility on both legs.  He voices his understanding and is agreeable to proceed.

## 2017-10-29 NOTE — Assessment & Plan Note (Signed)
This is clearly an underlying cause of his vascular disease.  Smoking cessation would be of benefit to prevent further deterioration of his vascular disease as well as reduce his perioperative complication risk.

## 2017-10-29 NOTE — Assessment & Plan Note (Signed)
His blood pressure is quite high today.  I suspect he has undiagnosed hypertension.  We discussed that this increases his chance of progression of vascular disease and this may need to be brought under better control.

## 2017-10-29 NOTE — Progress Notes (Signed)
MRN : [id number]  Kyle Gordon is a 57 y.o. (1960-08-13) male who presents with chief complaint of  Chief Complaint  Patient presents with  . Follow-up    [facility] 2week  .  History of Present Illness: Patient returns today in follow up of fear peripheral arterial disease.  He is still having a fair bit of Gordon in both feet consistent with rest Gordon.  He has a persistent ulceration on the right foot.  This has not changed dramatically since his angiogram last month.  At the time of his angiogram he was found to have an aortoiliac occlusion.  He had no periprocedural complications.  His access sites are healed. No fever or chills.  No chest Gordon or shortness of breath. He has subsequently undergone a CT angiogram of the abdomen and pelvis which I have independently reviewed.  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  The iliacs are occluded and reconstituted in the proximal common femoral arteries which were also diseased.  He has SFA occlusions bilaterally.  Current Outpatient Medications  Medication Sig Dispense Refill  . celecoxib (CELEBREX) 100 MG capsule Take 100 mg by mouth 2 (two) times daily.    . traMADol (ULTRAM) 50 MG tablet Take 1 tablet (50 mg total) by mouth every 6 (six) hours as needed for severe Gordon. (Patient not taking: Reported on 10/29/2017) 20 tablet 0   No current facility-administered medications for this visit.     Past Medical History:  Diagnosis Date  . Arthritis     Past Surgical History:  Procedure Laterality Date  . LOWER EXTREMITY ANGIOGRAPHY Right 10/14/2017   Procedure: LOWER EXTREMITY ANGIOGRAPHY;  Surgeon: Algernon Huxley, MD;  Location: [facility];  Service: Cardiovascular;  Laterality: Right;  . MOUTH SURGERY      Social History Social History   Tobacco Use  . Smoking status: Current Every Day Smoker  . Smokeless  tobacco: Never Used  Substance Use Topics  . Alcohol use: Yes  . Drug use: Yes    Types: Marijuana    Family History Family History  Problem Relation Age of Onset  . Heart attack Father   No bleeding disorders, clotting disorders, or aneurysms  No Known Allergies   REVIEW OF SYSTEMS (Negative unless checked)  Constitutional: '[]' Weight loss  '[]' Fever  '[]' Chills Cardiac: '[]' Chest Gordon   '[]' Chest pressure   '[]' Palpitations   '[]' Shortness of breath when laying flat   '[]' Shortness of breath at rest   '[]' Shortness of breath with exertion. Vascular:  '[x]' Gordon in legs with walking   '[]' Gordon in legs at rest   '[]' Gordon in legs when laying flat   '[]' Claudication   '[]' Gordon in feet when walking  '[x]' Gordon in feet at rest  '[]' Gordon in feet when laying flat   '[]' History of DVT   '[]' Phlebitis   '[]' Swelling in legs   '[]' Varicose veins   '[]' Non-healing ulcers Pulmonary:   '[]' Uses home oxygen   '[]' Productive cough   '[]' Hemoptysis   '[]' Wheeze  '[]' COPD   '[]' Asthma Neurologic:  '[]' Dizziness  '[]' Blackouts   '[]' Seizures   '[]' History of stroke   '[]' History of TIA  '[]' Aphasia   '[]' Temporary blindness   '[]' Dysphagia   '[]' Weakness or numbness in arms   '[]' Weakness or numbness in legs Musculoskeletal:  '[]' Arthritis   '[]' Joint swelling   '[]' Joint Gordon   '[]' Low back Gordon Hematologic:  '[]' Easy bruising  '[]'   Easy bleeding   '[]' Hypercoagulable state   '[]' Anemic   Gastrointestinal:  '[]' Blood in stool   '[]' Vomiting blood  '[]' Gastroesophageal reflux/heartburn   '[]' Abdominal Gordon Genitourinary:  '[]' Chronic kidney disease   '[]' Difficult urination  '[]' Frequent urination  '[]' Burning with urination   '[]' Hematuria Skin:  '[]' Rashes   '[x]' Ulcers   '[x]' Wounds Psychological:  '[]' History of anxiety   '[]'  History of major depression.  Physical Examination  BP (!) 185/96 (BP Location: Right Arm)   Pulse 88   Resp 17   Ht '5\' 7"'  (1.702 m)   Wt 119 lb 9.6 oz (54.3 kg)   BMI 18.73 kg/m  Gen: Thin, NAD, appears older than stated age Head: [place]/AT, No temporalis wasting. Ear/Nose/Throat:  Hearing grossly intact, nares w/o erythema or drainage Eyes: Conjunctiva clear. Sclera non-icteric Neck: Supple.  Trachea midline Pulmonary:  Good air movement, no use of accessory muscles.  Cardiac: RRR, no JVD Vascular:  Vessel Right Left  Radial Palpable Palpable                          PT  not palpable  not palpable  DP  not palpable  not palpable   Gastrointestinal: soft, non-tender/non-distended.  Musculoskeletal: M/S 5/5 throughout.  Ulceration on the right foot currently dressed. Neurologic: Sensation grossly intact in extremities.  Symmetrical.  Speech is fluent.  Psychiatric: Judgment and insight appear to be fair.  Affect is somewhat flat Dermatologic: Ulceration on the right foot       Labs Recent Results (from the past 2160 hour(s))  Glucose, capillary     Status: None   Collection Time: 09/18/17  4:55 PM  Result Value Ref Range   Glucose-Capillary 98 70 - 99 mg/dL  BUN     Status: None   Collection Time: 10/14/17  7:58 AM  Result Value Ref Range   BUN 6 6 - 20 mg/dL    Comment: Performed at [facility], [address] [place], [place] [address]  Creatinine, serum     Status: None   Collection Time: 10/14/17  7:58 AM  Result Value Ref Range   Creatinine, Ser 0.80 0.61 - 1.24 mg/dL   GFR calc non Af Amer >60 >60 mL/min   GFR calc Af Amer >60 >60 mL/min    Comment: (NOTE) The eGFR has been calculated using the CKD EPI equation. This calculation has not been validated in all clinical situations. eGFR's persistently <60 mL/min signify possible Chronic Kidney Disease. Performed at [facility], [address] [place], [place] [address]     Radiology Ct Angio Abd/pel W/ And/or W/o  Result Date: 10/14/2017 CLINICAL DATA:  brought over from vascular lab. Per RN pt c/o foot Gordon has no distal pulses. Eval arterial stricture/occlusion. Pt did receive 70m isovue while in vascular lab. EXAM: CTA ABDOMEN AND PELVIS WITH CONTRAST  TECHNIQUE: Multidetector CT imaging of the abdomen and pelvis was performed using the standard protocol during bolus administration of intravenous contrast. Multiplanar reconstructed images and MIPs were obtained and reviewed to evaluate the vascular anatomy. CONTRAST:  848mISOVUE-370 IOPAMIDOL (ISOVUE-370) INJECTION 76% COMPARISON:  05/17/2008 FINDINGS: VASCULAR Aorta: Scattered calcified atheromatous plaque in the visualized distal descending thoracic and suprarenal segments. Significant mural thrombus in the infrarenal segment to the level of the IMA origin. Aortic occlusion below the IMA. No aneurysm or dissection. Celiac: Calcified ostial plaque with only mild stenosis, patent distally. SMA: Scattered calcified plaque without stenosis. Classic distal branch anatomy. Renals: Partial  calcified right ostial plaque resulting in at least mild stenosis, patent distally. Scattered nonocclusive calcified plaque on the left. IMA: Patent without evidence of aneurysm, dissection, vasculitis or significant stenosis. Inflow: The infrarenal aortic occlusion extends through bilateral common, external, and internal iliac arteries. Proximal Outflow: On the right, there is reconstitution of the distal external iliac at the common femoral artery, patent distally. Proximal occlusion of the SFA through its visualized portion, incompletely visualized distally. Deep femoral branches patent. On the left, there is reconstitution of the proximal common femoral, patent through its length. Origin occlusion of the SFA through its visualized proximal segment. Deep femoral branches patent. Veins: Patent hepatic veins, portal vein, SMV, splenic vein, renal veins. Circumaortic left renal vein, an anatomic variant. No venous pathology identified. Review of the MIP images confirms the above findings. NON-VASCULAR Lower chest: Pulmonary emphysema. 5 mm subpleural nodule, lateral basal segment left lower lobe image 19/15, stable since 2010  presumably benign. 4 mm subpleural posterolateral right lower lobe nodule image 17/15, new since previous. No pleural or pericardial effusion. Hepatobiliary: No focal liver abnormality is seen. No gallstones, gallbladder wall thickening, or biliary dilatation. Pancreas: Unremarkable. No pancreatic ductal dilatation or surrounding inflammatory changes. Spleen: Normal in size without focal abnormality. Adrenals/Urinary Tract: Adrenal glands are unremarkable. Kidneys are normal, without renal calculi, focal lesion, or hydronephrosis. Distended urinary bladder. Stomach/Bowel: Stomach is distended by ingested material. Small bowel is nondilated. Normal appendix. Colon is nondilated, unremarkable. Lymphatic: No abdominal or pelvic adenopathy. Reproductive: Mild prostatic enlargement. Other: No ascites.  No free air. Musculoskeletal: Endplate spurring in the lower lumbar spine without significant disc narrowing. No fracture or worrisome bone lesion. IMPRESSION: VASCULAR 1. Distal aortic occlusion extending through the iliac arterial system bilaterally. 2. Reconstitution of proximal common femoral arteries bilaterally, with proximal occlusion of bilateral superficial femoral arteries. NON-VASCULAR 1. Distended urinary bladder 2. Emphysema with new 4 mm right lower lobe pulmonary nodule since 2010. Non-contrast chest CT can be considered in 12 months if patient is high-risk. This recommendation follows the consensus statement: Guidelines for Management of Incidental Pulmonary Nodules Detected on CT Images: From the Fleischner Society 2017; Radiology 2017; 284:228-243. Electronically Signed   By: Lucrezia Europe M.D.   On: 10/14/2017 11:40    Assessment/Plan  Tobacco use disorder This is clearly an underlying cause of his vascular disease.  Smoking cessation would be of benefit to prevent further deterioration of his vascular disease as well as reduce his perioperative complication risk.  Elevated BP without diagnosis of  hypertension His blood pressure is quite high today.  I suspect he has undiagnosed hypertension.  We discussed that this increases his chance of progression of vascular disease and this may need to be brought under better control.  Atherosclerosis of native arteries of the extremities with ulceration ([place]) The patient has a critical and limb threatening situation.  He has rest Gordon bilaterally and an ulceration on the right foot. He has subsequently undergone a CT angiogram of the abdomen and pelvis which I have independently reviewed.  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  The iliacs are occluded and reconstituted in the proximal common femoral arteries which were also diseased.  He has SFA occlusions bilaterally. This is a very severe and worrisome problem.  He is going to need a major arterial reconstruction in the form of an aortobifemoral bypass likely with a concomitant aortic endarterectomy as  he has significant plaque all the way up to the renal arteries.  Bilateral femoral endarterectomies are also going to be required as he has femoral disease as well.  On the right, with ulceration and an SFA occlusion we could either add a femoral to popliteal bypass or try to recanalize his SFA with stents which would be a less invasive option on an already major long operation.  I think that would be a better option.  I have discussed the grave nature of the situation with the patient.  I discussed that limb loss is still certainly a possibility on both legs.  He voices his understanding and is agreeable to proceed.    Kyle Pain, MD  10/29/2017 11:27 AM    This note was created with Dragon medical transcription system.  Any errors from dictation are purely unintentional

## 2017-11-01 ENCOUNTER — Other Ambulatory Visit (INDEPENDENT_AMBULATORY_CARE_PROVIDER_SITE_OTHER): Payer: Self-pay | Admitting: Nurse Practitioner

## 2017-11-06 ENCOUNTER — Other Ambulatory Visit: Payer: Self-pay

## 2017-11-06 ENCOUNTER — Encounter
Admission: RE | Admit: 2017-11-06 | Discharge: 2017-11-06 | Disposition: A | Discharge status: Home / Self Care | Payer: Self-pay | Source: Ambulatory Visit | Attending: Vascular Surgery | Admitting: Vascular Surgery

## 2017-11-06 DIAGNOSIS — I7025 Atherosclerosis of native arteries of other extremities with ulceration: Secondary | ICD-10-CM | POA: Insufficient documentation

## 2017-11-06 DIAGNOSIS — I10 Essential (primary) hypertension: Secondary | ICD-10-CM | POA: Insufficient documentation

## 2017-11-06 DIAGNOSIS — Z01818 Encounter for other preprocedural examination: Secondary | ICD-10-CM | POA: Insufficient documentation

## 2017-11-06 HISTORY — DX: Essential (primary) hypertension: I10

## 2017-11-06 HISTORY — DX: Peripheral vascular disease, unspecified: I73.9

## 2017-11-06 LAB — BASIC METABOLIC PANEL
Anion gap: 12 (ref 5–15)
BUN: 6 mg/dL (ref 6–20)
CALCIUM: 9.1 mg/dL (ref 8.9–10.3)
CO2: 23 mmol/L (ref 22–32)
CREATININE: 0.52 mg/dL — AB (ref 0.61–1.24)
Chloride: 97 mmol/L — ABNORMAL LOW (ref 98–111)
GFR calc Af Amer: >60 mL/min (ref >60)
GFR calc non Af Amer: >60 mL/min (ref >60)
Glucose, Bld: 88 mg/dL (ref 70–99)
Potassium: 4.2 mmol/L (ref 3.5–5.1)
SODIUM: 132 mmol/L — AB (ref 135–145)

## 2017-11-06 LAB — CBC WITH DIFFERENTIAL/PLATELET
Abs Immature Granulocytes: 0.01 10*3/uL (ref 0.00–0.07)
Basophils Absolute: 0 10*3/uL (ref 0.0–0.1)
Basophils Relative: 1 %
EOS PCT: 0 %
Eosinophils Absolute: 0 10*3/uL (ref 0.0–0.5)
HEMATOCRIT: 43.1 % (ref 39.0–52.0)
HEMOGLOBIN: 15.3 g/dL (ref 13.0–17.0)
Immature Granulocytes: 0 %
LYMPHS ABS: 1.9 10*3/uL (ref 0.7–4.0)
LYMPHS PCT: 37 %
MCH: 37.5 pg — AB (ref 26.0–34.0)
MCHC: 35.5 g/dL (ref 30.0–36.0)
MCV: 105.6 fL — ABNORMAL HIGH (ref 80.0–100.0)
MONO ABS: 0.4 10*3/uL (ref 0.1–1.0)
Monocytes Relative: 7 %
Neutro Abs: 2.8 10*3/uL (ref 1.7–7.7)
Neutrophils Relative %: 55 %
Platelets: 211 10*3/uL (ref 150–400)
RBC: 4.08 MIL/uL — ABNORMAL LOW (ref 4.22–5.81)
RDW: 12.2 % (ref 11.5–15.5)
WBC: 5.1 10*3/uL (ref 4.0–10.5)
nRBC: 0 % (ref 0.0–0.2)

## 2017-11-06 LAB — SURGICAL PCR SCREEN
MRSA, PCR: NEGATIVE
STAPHYLOCOCCUS AUREUS: NEGATIVE

## 2017-11-06 LAB — PROTIME-INR
INR: 0.83
Prothrombin Time: 11.3 seconds — ABNORMAL LOW (ref 11.4–15.2)

## 2017-11-06 LAB — APTT: aPTT: 27 seconds (ref 24–36)

## 2017-11-06 NOTE — Pre-Procedure Instructions (Signed)
BP ELEVATED AT OFFICE VISIT AND AT PREADMIT VISIT. NO PCP. AS INSTRUCTED BY DR Lorin Picket, REQUEST FOR MEDICAL CLEARANCE FAXED AND CALLED TO DR DEW'S OFFICE. LM FOR LAURA

## 2017-11-06 NOTE — Patient Instructions (Signed)
Your procedure is scheduled on: 11/13/17 Report to Day Surgery. [facility] SECOND FLOOR To find out your arrival time please call [phone] between 1PM - 3PM on 11/12/17  Remember: Instructions that are not followed completely may result in serious medical risk,  up to and including death, or upon the discretion of your surgeon and anesthesiologist your  surgery may need to be rescheduled.     _X__ 1. Do not eat food after midnight the night before your procedure.                 No gum chewing or hard candies. You may drink clear liquids up to 2 hours                 before you are scheduled to arrive for your surgery- DO not drink clear                 liquids within 2 hours of the start of your surgery.                 Clear Liquids include:  water, apple juice without pulp, clear carbohydrate                 drink such as Clearfast of Gatorade, Black Coffee or Tea (Do not add                 anything to coffee or tea).  __X__2.  On the morning of surgery brush your teeth with toothpaste and water, you                may rinse your mouth with mouthwash if you wish.  Do not swallow any toothpaste of mouthwash.     _X__ 3.  No Alcohol for 24 hours before or after surgery.   _X__ 4.  Do Not Smoke or use e-cigarettes For 24 Hours Prior to Your Surgery.                 Do not use any chewable tobacco products for at least 6 hours prior to                 surgery.  ____  5.  Bring all medications with you on the day of surgery if instructed.   __X__  6.  Notify your doctor if there is any change in your medical condition      (cold, fever, infections).     Do not wear jewelry, make-up, hairpins, clips or nail polish. Do not wear lotions, powders, or perfumes. You may wear deodorant. Do not shave 48 hours prior to surgery. Men may shave face and neck. Do not bring valuables to the hospital.    [facility] is not responsible for any belongings or  valuables.  Contacts, dentures or bridgework may not be worn into surgery. Leave your suitcase in the car. After surgery it may be brought to your room. For patients admitted to the hospital, discharge time is determined by your treatment team.   Patients discharged the day of surgery will not be allowed to drive home.   Please read over the following fact sheets that you were given:   Surgical Site Infection Prevention / MRSA    ____ Take these medicines the morning of surgery with A SIP OF WATER:    1. NONE  2.   3.   4.  5.  6.  ____ Fleet Enema (as directed)   __X__ Use CHG Soap  as directed  ____ Use inhalers on the day of surgery  ____ Stop metformin 2 days prior to surgery    ____ Take 1/2 of usual insulin dose the night before surgery. No insulin the morning          of surgery.   ____ Stop Coumadin/Plavix/aspirin on  ____ Stop Anti-inflammatories on   ____ Stop supplements until after surgery.    ____ Bring C-Pap to the hospital.

## 2017-11-07 ENCOUNTER — Telehealth (INDEPENDENT_AMBULATORY_CARE_PROVIDER_SITE_OTHER): Payer: Self-pay

## 2017-11-07 NOTE — Telephone Encounter (Signed)
Called the patient again and the person answering the phone stated she was his mother and gave me his mobile number [phone]) to contact the patient. I had to leave a message for the patient stating that Dr. America Brown office will be calling his mother number most likely to get him in to be cleared for surgery and that it was imperative for him to keep his appts with Dr. America Brown office.

## 2017-11-07 NOTE — Telephone Encounter (Signed)
I received the information from pre-admit stating the patient needs a medical clearance before his surgery on 10/16 ( Aorta bifemoral bypass ). Per Cordelia Pen the patient stated he did not have a PCP so it was sent over for me to contact the patient and help  him find a PCP. I called the patient this morning to give the the patient the [facility] network number [phone] )  to help him find a PCP and was told that he wasn't there and they couldn't find a pin/pencil to write the number down so the patient will call me back later.

## 2017-11-11 ENCOUNTER — Other Ambulatory Visit: Payer: Self-pay | Admitting: Rehabilitative and Restorative Service Providers"

## 2017-11-11 DIAGNOSIS — I7025 Atherosclerosis of native arteries of other extremities with ulceration: Secondary | ICD-10-CM

## 2017-11-12 ENCOUNTER — Ambulatory Visit
Admission: RE | Admit: 2017-11-12 | Discharge: 2017-11-12 | Disposition: A | Discharge status: Home / Self Care | Payer: Self-pay | Source: Ambulatory Visit | Attending: Rehabilitative and Restorative Service Providers" | Admitting: Rehabilitative and Restorative Service Providers"

## 2017-11-12 DIAGNOSIS — I7025 Atherosclerosis of native arteries of other extremities with ulceration: Secondary | ICD-10-CM | POA: Insufficient documentation

## 2017-11-12 LAB — NM MYOCAR MULTI W/SPECT W/WALL MOTION / EF
CHL CUP MPHR: 164 {beats}/min
CHL CUP RESTING HR STRESS: 75 {beats}/min
CSEPED: 1 min
CSEPEDS: 1 s
CSEPEW: 1 METS
LV dias vol: 54 mL (ref 62–150)
LVSYSVOL: 24 mL
Peak HR: 123 {beats}/min
Percent HR: 75 %
SDS: 0
SRS: 4
SSS: 0
TID: 1.03

## 2017-11-12 MED ORDER — TECHNETIUM TC 99M TETROFOSMIN IV KIT
10.1400 | PACK | Freq: Once | INTRAVENOUS | Status: AC | PRN
  Administered 2017-11-12: 10.14 via INTRAVENOUS

## 2017-11-12 MED ORDER — CEFAZOLIN SODIUM-DEXTROSE 2-4 GM/100ML-% IV SOLN
2.0000 g | INTRAVENOUS | Status: AC
  Administered 2017-11-13: 2 g via INTRAVENOUS
  Administered 2017-11-13: 1 g via INTRAVENOUS

## 2017-11-12 MED ORDER — REGADENOSON 0.4 MG/5ML IV SOLN
0.4000 mg | Freq: Once | INTRAVENOUS | Status: AC
  Administered 2017-11-12: 0.4 mg via INTRAVENOUS

## 2017-11-12 MED ORDER — TECHNETIUM TC 99M TETROFOSMIN IV KIT
30.5200 | PACK | Freq: Once | INTRAVENOUS | Status: AC | PRN
  Administered 2017-11-12: 30.52 via INTRAVENOUS

## 2017-11-13 ENCOUNTER — Inpatient Hospital Stay: Payer: Self-pay | Admitting: Registered Nurse

## 2017-11-13 ENCOUNTER — Inpatient Hospital Stay: Payer: Self-pay

## 2017-11-13 ENCOUNTER — Other Ambulatory Visit: Payer: Self-pay

## 2017-11-13 ENCOUNTER — Inpatient Hospital Stay
Admission: RE | Admit: 2017-11-13 | Discharge: 2017-11-18 | DRG: 271 | Disposition: A | Discharge status: Home / Self Care | Payer: Self-pay | Attending: Vascular Surgery | Admitting: Vascular Surgery

## 2017-11-13 ENCOUNTER — Encounter
Admission: RE | Disposition: A | Discharge status: Home / Self Care | Payer: Self-pay | Source: Home / Self Care | Attending: Vascular Surgery

## 2017-11-13 DIAGNOSIS — R008 Other abnormalities of heart beat: Secondary | ICD-10-CM | POA: Diagnosis not present

## 2017-11-13 DIAGNOSIS — I70203 Unspecified atherosclerosis of native arteries of extremities, bilateral legs: Principal | ICD-10-CM | POA: Diagnosis present

## 2017-11-13 DIAGNOSIS — L97519 Non-pressure chronic ulcer of other part of right foot with unspecified severity: Secondary | ICD-10-CM | POA: Diagnosis present

## 2017-11-13 DIAGNOSIS — I70235 Atherosclerosis of native arteries of right leg with ulceration of other part of foot: Secondary | ICD-10-CM

## 2017-11-13 DIAGNOSIS — E876 Hypokalemia: Secondary | ICD-10-CM | POA: Diagnosis not present

## 2017-11-13 DIAGNOSIS — F17200 Nicotine dependence, unspecified, uncomplicated: Secondary | ICD-10-CM | POA: Diagnosis present

## 2017-11-13 DIAGNOSIS — Z452 Encounter for adjustment and management of vascular access device: Secondary | ICD-10-CM

## 2017-11-13 DIAGNOSIS — D62 Acute posthemorrhagic anemia: Secondary | ICD-10-CM | POA: Diagnosis not present

## 2017-11-13 DIAGNOSIS — D638 Anemia in other chronic diseases classified elsewhere: Secondary | ICD-10-CM | POA: Diagnosis present

## 2017-11-13 DIAGNOSIS — I700 Atherosclerosis of aorta: Secondary | ICD-10-CM | POA: Diagnosis present

## 2017-11-13 DIAGNOSIS — I998 Other disorder of circulatory system: Secondary | ICD-10-CM | POA: Diagnosis present

## 2017-11-13 DIAGNOSIS — I70222 Atherosclerosis of native arteries of extremities with rest pain, left leg: Secondary | ICD-10-CM

## 2017-11-13 DIAGNOSIS — I10 Essential (primary) hypertension: Secondary | ICD-10-CM | POA: Diagnosis present

## 2017-11-13 DIAGNOSIS — L97909 Non-pressure chronic ulcer of unspecified part of unspecified lower leg with unspecified severity: Secondary | ICD-10-CM

## 2017-11-13 DIAGNOSIS — K219 Gastro-esophageal reflux disease without esophagitis: Secondary | ICD-10-CM | POA: Diagnosis present

## 2017-11-13 DIAGNOSIS — F1010 Alcohol abuse, uncomplicated: Secondary | ICD-10-CM | POA: Diagnosis present

## 2017-11-13 DIAGNOSIS — I70299 Other atherosclerosis of native arteries of extremities, unspecified extremity: Secondary | ICD-10-CM | POA: Diagnosis present

## 2017-11-13 DIAGNOSIS — E8770 Fluid overload, unspecified: Secondary | ICD-10-CM | POA: Diagnosis not present

## 2017-11-13 DIAGNOSIS — M1990 Unspecified osteoarthritis, unspecified site: Secondary | ICD-10-CM | POA: Diagnosis present

## 2017-11-13 DIAGNOSIS — Z8249 Family history of ischemic heart disease and other diseases of the circulatory system: Secondary | ICD-10-CM

## 2017-11-13 HISTORY — PX: AORTA - BILATERAL FEMORAL ARTERY BYPASS GRAFT: SHX1175

## 2017-11-13 HISTORY — PX: INSERTION OF ILIAC STENT: SHX6256

## 2017-11-13 LAB — URINE DRUG SCREEN, QUALITATIVE (ARMC ONLY)
AMPHETAMINES, UR SCREEN: NOT DETECTED
Barbiturates, Ur Screen: NOT DETECTED
Benzodiazepine, Ur Scrn: NOT DETECTED
COCAINE METABOLITE, UR ~~LOC~~: NOT DETECTED
Cannabinoid 50 Ng, Ur ~~LOC~~: POSITIVE — AB
MDMA (Ecstasy)Ur Screen: NOT DETECTED
METHADONE SCREEN, URINE: NOT DETECTED
Opiate, Ur Screen: NOT DETECTED
Phencyclidine (PCP) Ur S: NOT DETECTED
TRICYCLIC, UR SCREEN: NOT DETECTED

## 2017-11-13 LAB — CBC
HEMATOCRIT: 37.1 % — AB (ref 39.0–52.0)
HEMOGLOBIN: 12.7 g/dL — AB (ref 13.0–17.0)
MCH: 37.7 pg — AB (ref 26.0–34.0)
MCHC: 34.2 g/dL (ref 30.0–36.0)
MCV: 110.1 fL — ABNORMAL HIGH (ref 80.0–100.0)
Platelets: 160 10*3/uL (ref 150–400)
RBC: 3.37 MIL/uL — AB (ref 4.22–5.81)
RDW: 12.1 % (ref 11.5–15.5)
WBC: 10.3 10*3/uL (ref 4.0–10.5)
nRBC: 0 % (ref 0.0–0.2)

## 2017-11-13 LAB — APTT: aPTT: 57 seconds — ABNORMAL HIGH (ref 24–36)

## 2017-11-13 LAB — ABO/RH: ABO/RH(D): A POS

## 2017-11-13 LAB — BASIC METABOLIC PANEL
Anion gap: 12 (ref 5–15)
BUN: 8 mg/dL (ref 6–20)
CHLORIDE: 101 mmol/L (ref 98–111)
CO2: 21 mmol/L — AB (ref 22–32)
CREATININE: 0.56 mg/dL — AB (ref 0.61–1.24)
Calcium: 7.9 mg/dL — ABNORMAL LOW (ref 8.9–10.3)
GFR calc non Af Amer: >60 mL/min (ref >60)
Glucose, Bld: 191 mg/dL — ABNORMAL HIGH (ref 70–99)
Potassium: 4.5 mmol/L (ref 3.5–5.1)
Sodium: 134 mmol/L — ABNORMAL LOW (ref 135–145)

## 2017-11-13 LAB — MAGNESIUM: Magnesium: 1.7 mg/dL (ref 1.7–2.4)

## 2017-11-13 LAB — PROTIME-INR
INR: 1.02
PROTHROMBIN TIME: 13.3 s (ref 11.4–15.2)

## 2017-11-13 LAB — GLUCOSE, CAPILLARY: Glucose-Capillary: 183 mg/dL — ABNORMAL HIGH (ref 70–99)

## 2017-11-13 SURGERY — CREATION, BYPASS, ARTERIAL, AORTA TO FEMORAL, BILATERAL, USING GRAFT
Anesthesia: General | Laterality: Right

## 2017-11-13 MED ORDER — LACTATED RINGERS IV BOLUS
500.0000 mL | Freq: Once | INTRAVENOUS | Status: AC
  Administered 2017-11-13: 500 mL via INTRAVENOUS

## 2017-11-13 MED ORDER — DEXAMETHASONE SODIUM PHOSPHATE 10 MG/ML IJ SOLN
INTRAMUSCULAR | Status: AC
  Filled 2017-11-13: qty 1

## 2017-11-13 MED ORDER — ESMOLOL HCL 100 MG/10ML IV SOLN
INTRAVENOUS | Status: AC
  Filled 2017-11-13: qty 10

## 2017-11-13 MED ORDER — FAMOTIDINE 20 MG PO TABS
ORAL_TABLET | ORAL | Status: AC
  Filled 2017-11-13: qty 1

## 2017-11-13 MED ORDER — SUGAMMADEX SODIUM 200 MG/2ML IV SOLN
INTRAVENOUS | Status: AC
  Filled 2017-11-13: qty 2

## 2017-11-13 MED ORDER — HYDRALAZINE HCL 20 MG/ML IJ SOLN
5.0000 mg | INTRAMUSCULAR | Status: DC | PRN
  Administered 2017-11-13: 5 mg via INTRAVENOUS
  Filled 2017-11-13: qty 1

## 2017-11-13 MED ORDER — ENOXAPARIN SODIUM 40 MG/0.4ML ~~LOC~~ SOLN
40.0000 mg | SUBCUTANEOUS | Status: DC
  Administered 2017-11-14 – 2017-11-18 (×5): 40 mg via SUBCUTANEOUS
  Filled 2017-11-13 (×5): qty 0.4

## 2017-11-13 MED ORDER — ACETAMINOPHEN 10 MG/ML IV SOLN
INTRAVENOUS | Status: AC
  Filled 2017-11-13: qty 100

## 2017-11-13 MED ORDER — ESMOLOL HCL-SODIUM CHLORIDE 2000 MG/100ML IV SOLN
25.0000 ug/kg/min | INTRAVENOUS | Status: DC
  Filled 2017-11-13: qty 100

## 2017-11-13 MED ORDER — ONDANSETRON HCL 4 MG/2ML IJ SOLN: 4.0000 mg | Freq: Four times a day (QID) | INTRAMUSCULAR | Status: DC | PRN

## 2017-11-13 MED ORDER — DEXAMETHASONE SODIUM PHOSPHATE 10 MG/ML IJ SOLN
INTRAMUSCULAR | Status: DC | PRN
  Administered 2017-11-13: 4 mg via INTRAVENOUS

## 2017-11-13 MED ORDER — GUAIFENESIN-DM 100-10 MG/5ML PO SYRP
15.0000 mL | ORAL_SOLUTION | ORAL | Status: DC | PRN
  Filled 2017-11-13: qty 15

## 2017-11-13 MED ORDER — SODIUM CHLORIDE 0.9 % IV SOLN
INTRAVENOUS | Status: DC | PRN
  Administered 2017-11-13: 100 mL via INTRAMUSCULAR

## 2017-11-13 MED ORDER — CEFAZOLIN SODIUM-DEXTROSE 2-4 GM/100ML-% IV SOLN
2.0000 g | Freq: Three times a day (TID) | INTRAVENOUS | Status: AC
  Administered 2017-11-13 – 2017-11-14 (×2): 2 g via INTRAVENOUS
  Filled 2017-11-13 (×2): qty 100

## 2017-11-13 MED ORDER — PROPOFOL 10 MG/ML IV BOLUS
INTRAVENOUS | Status: DC | PRN
  Administered 2017-11-13: 30 mg via INTRAVENOUS
  Administered 2017-11-13: 130 mg via INTRAVENOUS

## 2017-11-13 MED ORDER — PHENYLEPHRINE HCL 10 MG/ML IJ SOLN
INTRAMUSCULAR | Status: AC
  Filled 2017-11-13: qty 1

## 2017-11-13 MED ORDER — HYDROMORPHONE HCL 1 MG/ML IJ SOLN
1.0000 mg | Freq: Once | INTRAMUSCULAR | Status: AC | PRN
  Administered 2017-11-13: 1 mg via INTRAVENOUS
  Filled 2017-11-13: qty 1

## 2017-11-13 MED ORDER — ALUM & MAG HYDROXIDE-SIMETH 200-200-20 MG/5ML PO SUSP
15.0000 mL | ORAL | Status: DC | PRN
  Filled 2017-11-13: qty 30

## 2017-11-13 MED ORDER — PHENYLEPHRINE HCL 10 MG/ML IJ SOLN
INTRAMUSCULAR | Status: DC | PRN
  Administered 2017-11-13: 100 ug via INTRAVENOUS
  Administered 2017-11-13: 150 ug via INTRAVENOUS
  Administered 2017-11-13 (×2): 100 ug via INTRAVENOUS
  Administered 2017-11-13: 150 ug via INTRAVENOUS
  Administered 2017-11-13: 100 ug via INTRAVENOUS
  Administered 2017-11-13: 50 ug via INTRAVENOUS
  Administered 2017-11-13 (×3): 100 ug via INTRAVENOUS

## 2017-11-13 MED ORDER — METOPROLOL TARTRATE 5 MG/5ML IV SOLN: 2.0000 mg | INTRAVENOUS | Status: DC | PRN

## 2017-11-13 MED ORDER — HEPARIN SODIUM (PORCINE) 1000 UNIT/ML IJ SOLN
INTRAMUSCULAR | Status: AC
  Filled 2017-11-13: qty 1

## 2017-11-13 MED ORDER — SENNOSIDES-DOCUSATE SODIUM 8.6-50 MG PO TABS: 1.0000 | ORAL_TABLET | Freq: Every evening | ORAL | Status: DC | PRN

## 2017-11-13 MED ORDER — HEPARIN SODIUM (PORCINE) 10000 UNIT/ML IJ SOLN
INTRAMUSCULAR | Status: AC
  Filled 2017-11-13: qty 3

## 2017-11-13 MED ORDER — LACTATED RINGERS IV SOLN
INTRAVENOUS | Status: DC | PRN
  Administered 2017-11-13: 07:00:00 via INTRAVENOUS

## 2017-11-13 MED ORDER — LABETALOL HCL 5 MG/ML IV SOLN
10.0000 mg | INTRAVENOUS | Status: DC | PRN
  Administered 2017-11-13 (×2): 10 mg via INTRAVENOUS
  Filled 2017-11-13 (×2): qty 4

## 2017-11-13 MED ORDER — SORBITOL 70 % SOLN
30.0000 mL | Freq: Every day | Status: DC | PRN
  Filled 2017-11-13: qty 30

## 2017-11-13 MED ORDER — MAGNESIUM SULFATE 2 GM/50ML IV SOLN: 2.0000 g | Freq: Every day | INTRAVENOUS | Status: DC | PRN

## 2017-11-13 MED ORDER — SODIUM CHLORIDE 0.9 % IJ SOLN
INTRAMUSCULAR | Status: AC
  Filled 2017-11-13: qty 50

## 2017-11-13 MED ORDER — ONDANSETRON HCL 4 MG/2ML IJ SOLN
INTRAMUSCULAR | Status: DC | PRN
  Administered 2017-11-13: 4 mg via INTRAVENOUS

## 2017-11-13 MED ORDER — CEFAZOLIN SODIUM-DEXTROSE 2-4 GM/100ML-% IV SOLN
INTRAVENOUS | Status: AC
  Filled 2017-11-13: qty 100

## 2017-11-13 MED ORDER — KETAMINE HCL 10 MG/ML IJ SOLN
INTRAMUSCULAR | Status: DC | PRN
  Administered 2017-11-13: 30 mg via INTRAVENOUS
  Administered 2017-11-13: 10 mg via INTRAVENOUS
  Administered 2017-11-13: 20 mg via INTRAVENOUS

## 2017-11-13 MED ORDER — DOCUSATE SODIUM 100 MG PO CAPS
100.0000 mg | ORAL_CAPSULE | Freq: Every day | ORAL | Status: DC
  Administered 2017-11-17 – 2017-11-18 (×2): 100 mg via ORAL
  Filled 2017-11-13 (×3): qty 1

## 2017-11-13 MED ORDER — MIDAZOLAM HCL 2 MG/2ML IJ SOLN
INTRAMUSCULAR | Status: AC
  Filled 2017-11-13: qty 2

## 2017-11-13 MED ORDER — FAMOTIDINE 20 MG PO TABS: 20.0000 mg | ORAL_TABLET | Freq: Once | ORAL | Status: DC

## 2017-11-13 MED ORDER — EVICEL 5 ML EX KIT
PACK | CUTANEOUS | Status: DC | PRN
  Administered 2017-11-13: 5 mL

## 2017-11-13 MED ORDER — DEXMEDETOMIDINE HCL 200 MCG/2ML IV SOLN
INTRAVENOUS | Status: DC | PRN
  Administered 2017-11-13 (×3): 12 ug via INTRAVENOUS
  Administered 2017-11-13 (×3): 8 ug via INTRAVENOUS

## 2017-11-13 MED ORDER — OXYCODONE HCL 5 MG/5ML PO SOLN: 5.0000 mg | Freq: Once | ORAL | Status: DC | PRN

## 2017-11-13 MED ORDER — POTASSIUM CHLORIDE CRYS ER 20 MEQ PO TBCR: 20.0000 meq | EXTENDED_RELEASE_TABLET | Freq: Every day | ORAL | Status: DC | PRN

## 2017-11-13 MED ORDER — ACETAMINOPHEN 325 MG PO TABS: 325.0000 mg | ORAL_TABLET | ORAL | Status: DC | PRN

## 2017-11-13 MED ORDER — EVICEL 5 ML EX KIT
PACK | CUTANEOUS | Status: AC
  Filled 2017-11-13: qty 2

## 2017-11-13 MED ORDER — NITROGLYCERIN IN D5W 200-5 MCG/ML-% IV SOLN: 5.0000 ug/min | INTRAVENOUS | Status: DC

## 2017-11-13 MED ORDER — PROPOFOL 10 MG/ML IV BOLUS
INTRAVENOUS | Status: AC
  Filled 2017-11-13: qty 20

## 2017-11-13 MED ORDER — HEPARIN SODIUM (PORCINE) 5000 UNIT/ML IJ SOLN
INTRAMUSCULAR | Status: AC
  Filled 2017-11-13: qty 1

## 2017-11-13 MED ORDER — KETAMINE HCL 50 MG/ML IJ SOLN
INTRAMUSCULAR | Status: AC
  Filled 2017-11-13: qty 10

## 2017-11-13 MED ORDER — MIDAZOLAM HCL 2 MG/2ML IJ SOLN
INTRAMUSCULAR | Status: DC | PRN
  Administered 2017-11-13: 2 mg via INTRAVENOUS

## 2017-11-13 MED ORDER — ROCURONIUM BROMIDE 50 MG/5ML IV SOLN
INTRAVENOUS | Status: AC
  Filled 2017-11-13: qty 1

## 2017-11-13 MED ORDER — ONDANSETRON HCL 4 MG/2ML IJ SOLN
INTRAMUSCULAR | Status: AC
  Filled 2017-11-13: qty 2

## 2017-11-13 MED ORDER — ACETAMINOPHEN 650 MG RE SUPP: 325.0000 mg | RECTAL | Status: DC | PRN

## 2017-11-13 MED ORDER — SODIUM CHLORIDE 0.9 % IV SOLN
INTRAVENOUS | Status: DC
  Administered 2017-11-13: 150 mL/h via INTRAVENOUS
  Administered 2017-11-13 – 2017-11-16 (×6): via INTRAVENOUS

## 2017-11-13 MED ORDER — OXYMETAZOLINE HCL 0.05 % NA SOLN
NASAL | Status: AC
  Filled 2017-11-13: qty 15

## 2017-11-13 MED ORDER — LIDOCAINE HCL (CARDIAC) PF 100 MG/5ML IV SOSY
PREFILLED_SYRINGE | INTRAVENOUS | Status: DC | PRN
  Administered 2017-11-13: 60 mg via INTRAVENOUS

## 2017-11-13 MED ORDER — FENTANYL CITRATE (PF) 100 MCG/2ML IJ SOLN
INTRAMUSCULAR | Status: AC
  Filled 2017-11-13: qty 2

## 2017-11-13 MED ORDER — LACTATED RINGERS IV SOLN
INTRAVENOUS | Status: DC
  Administered 2017-11-13 (×2): via INTRAVENOUS

## 2017-11-13 MED ORDER — SODIUM CHLORIDE 0.9 % IV SOLN: 500.0000 mL | Freq: Once | INTRAVENOUS | Status: DC | PRN

## 2017-11-13 MED ORDER — ESMOLOL HCL-SODIUM CHLORIDE 2000 MG/100ML IV SOLN
25.0000 ug/kg/min | INTRAVENOUS | Status: DC | PRN
  Administered 2017-11-13: 25 ug/kg/min via INTRAVENOUS
  Administered 2017-11-14: 150 ug/kg/min via INTRAVENOUS
  Administered 2017-11-15: 75 ug/kg/min via INTRAVENOUS
  Administered 2017-11-16: 50 ug/kg/min via INTRAVENOUS
  Filled 2017-11-13 (×6): qty 100

## 2017-11-13 MED ORDER — ROCURONIUM BROMIDE 100 MG/10ML IV SOLN
INTRAVENOUS | Status: DC | PRN
  Administered 2017-11-13 (×2): 20 mg via INTRAVENOUS
  Administered 2017-11-13: 30 mg via INTRAVENOUS
  Administered 2017-11-13 (×3): 10 mg via INTRAVENOUS
  Administered 2017-11-13: 20 mg via INTRAVENOUS

## 2017-11-13 MED ORDER — DEXMEDETOMIDINE HCL IN NACL 80 MCG/20ML IV SOLN
INTRAVENOUS | Status: AC
  Filled 2017-11-13: qty 20

## 2017-11-13 MED ORDER — LIDOCAINE HCL URETHRAL/MUCOSAL 2 % EX GEL
CUTANEOUS | Status: AC
  Filled 2017-11-13: qty 5

## 2017-11-13 MED ORDER — OXYCODONE-ACETAMINOPHEN 5-325 MG PO TABS: 1.0000 | ORAL_TABLET | ORAL | Status: DC | PRN

## 2017-11-13 MED ORDER — FAMOTIDINE IN NACL 20-0.9 MG/50ML-% IV SOLN
20.0000 mg | Freq: Two times a day (BID) | INTRAVENOUS | Status: DC
  Administered 2017-11-13 – 2017-11-18 (×11): 20 mg via INTRAVENOUS
  Filled 2017-11-13 (×12): qty 50

## 2017-11-13 MED ORDER — FENTANYL CITRATE (PF) 100 MCG/2ML IJ SOLN
INTRAMUSCULAR | Status: DC | PRN
  Administered 2017-11-13: 100 ug via INTRAVENOUS
  Administered 2017-11-13: 12.5 ug via INTRAVENOUS
  Administered 2017-11-13: 25 ug via INTRAVENOUS
  Administered 2017-11-13 (×2): 50 ug via INTRAVENOUS
  Administered 2017-11-13 (×2): 25 ug via INTRAVENOUS
  Administered 2017-11-13: 50 ug via INTRAVENOUS
  Administered 2017-11-13: 12.5 ug via INTRAVENOUS

## 2017-11-13 MED ORDER — HEPARIN SODIUM (PORCINE) 1000 UNIT/ML IJ SOLN
INTRAMUSCULAR | Status: DC | PRN
  Administered 2017-11-13: 2000 [IU] via INTRAVENOUS
  Administered 2017-11-13: 5000 [IU] via INTRAVENOUS

## 2017-11-13 MED ORDER — CEFAZOLIN SODIUM 1 G IJ SOLR
INTRAMUSCULAR | Status: AC
  Filled 2017-11-13: qty 10

## 2017-11-13 MED ORDER — NITROGLYCERIN IN D5W 200-5 MCG/ML-% IV SOLN
5.0000 ug/min | INTRAVENOUS | Status: DC | PRN
  Filled 2017-11-13: qty 250

## 2017-11-13 MED ORDER — MORPHINE SULFATE (PF) 2 MG/ML IV SOLN
2.0000 mg | INTRAVENOUS | Status: DC | PRN
  Administered 2017-11-13: 4 mg via INTRAVENOUS
  Administered 2017-11-13: 2 mg via INTRAVENOUS
  Administered 2017-11-14 (×6): 4 mg via INTRAVENOUS
  Filled 2017-11-13 (×6): qty 2
  Filled 2017-11-13: qty 1
  Filled 2017-11-13 (×2): qty 2

## 2017-11-13 MED ORDER — SODIUM CHLORIDE 0.9 % IV SOLN
INTRAVENOUS | Status: DC | PRN
  Administered 2017-11-13: 25 ug/min via INTRAVENOUS

## 2017-11-13 MED ORDER — ACETAMINOPHEN 10 MG/ML IV SOLN
INTRAVENOUS | Status: DC | PRN
  Administered 2017-11-13: 1000 mg via INTRAVENOUS

## 2017-11-13 MED ORDER — PHENOL 1.4 % MT LIQD
1.0000 | OROMUCOSAL | Status: DC | PRN
  Filled 2017-11-13: qty 177

## 2017-11-13 MED ORDER — FENTANYL CITRATE (PF) 250 MCG/5ML IJ SOLN
INTRAMUSCULAR | Status: AC
  Filled 2017-11-13: qty 5

## 2017-11-13 MED ORDER — FENTANYL CITRATE (PF) 100 MCG/2ML IJ SOLN: 25.0000 ug | INTRAMUSCULAR | Status: DC | PRN

## 2017-11-13 MED ORDER — SUGAMMADEX SODIUM 200 MG/2ML IV SOLN
INTRAVENOUS | Status: DC | PRN
  Administered 2017-11-13: 110 mg via INTRAVENOUS

## 2017-11-13 MED ORDER — OXYCODONE HCL 5 MG PO TABS: 5.0000 mg | ORAL_TABLET | Freq: Once | ORAL | Status: DC | PRN

## 2017-11-13 MED ORDER — LIDOCAINE HCL (PF) 2 % IJ SOLN
INTRAMUSCULAR | Status: AC
  Filled 2017-11-13: qty 10

## 2017-11-13 MED ORDER — SUCCINYLCHOLINE CHLORIDE 20 MG/ML IJ SOLN
INTRAMUSCULAR | Status: AC
  Filled 2017-11-13: qty 1

## 2017-11-13 SURGICAL SUPPLY — 108 items
"PENCIL ELECTRO HAND CTR " (MISCELLANEOUS) ×2 IMPLANT
APPLIER CLIP 11 MED OPEN (CLIP) ×4
APPLIER CLIP 13 LRG OPEN (CLIP)
APPLIER CLIP 9.375 SM OPEN (CLIP)
BAG COUNTER SPONGE EZ (MISCELLANEOUS) ×2 IMPLANT
BAG DECANTER FOR FLEXI CONT (MISCELLANEOUS) ×4 IMPLANT
BAG ISOLATATION DRAPE 20X20 ST (DRAPES) ×2 IMPLANT
BLADE SURG 15 STRL LF DISP TIS (BLADE) ×2 IMPLANT
BLADE SURG 15 STRL SS (BLADE) ×2
BLADE SURG SZ10 CARB STEEL (BLADE) ×4 IMPLANT
BLADE SURG SZ11 CARB STEEL (BLADE) ×4 IMPLANT
BOOT SUTURE AID YELLOW STND (SUTURE) ×6 IMPLANT
BRUSH SCRUB EZ  4% CHG (MISCELLANEOUS) ×2
BRUSH SCRUB EZ 4% CHG (MISCELLANEOUS) ×2 IMPLANT
CANISTER SUCT 3000ML PPV (MISCELLANEOUS) ×4 IMPLANT
CANNULA 5F STIFF (CANNULA) ×2 IMPLANT
CATH BEACON 5 .035 40 KMP TP (CATHETERS) IMPLANT
CATH BEACON 5 .035 65 KMP TIP (CATHETERS) ×2 IMPLANT
CATH BEACON 5 .038 40 KMP TP (CATHETERS) ×2
CELL SAVER ADDITIONAL TIME PER (MISCELLANEOUS) ×720
CELL SAVER COLL SVCS (MISCELLANEOUS) ×4
CLIP APPLIE 11 MED OPEN (CLIP) IMPLANT
CLIP APPLIE 13 LRG OPEN (CLIP) IMPLANT
CLIP APPLIE 9.375 SM OPEN (CLIP) IMPLANT
COUNTER SPONGE BAG EZ (MISCELLANEOUS)
COVER PROBE FLX POLY STRL (MISCELLANEOUS) ×4 IMPLANT
COVER WAND RF STERILE (DRAPES) ×4 IMPLANT
DERMABOND ADVANCED (GAUZE/BANDAGES/DRESSINGS) ×4
DERMABOND ADVANCED .7 DNX12 (GAUZE/BANDAGES/DRESSINGS) IMPLANT
DEVICE PRESTO INFLATION (MISCELLANEOUS) ×2 IMPLANT
DEVICE TORQUE .025-.038 (MISCELLANEOUS) ×2 IMPLANT
DRAPE C-ARM XRAY 36X54 (DRAPES) ×2 IMPLANT
DRAPE INCISE IOBAN 66X45 STRL (DRAPES) ×4 IMPLANT
DRAPE INCISE IOBAN 66X60 STRL (DRAPES) ×2 IMPLANT
DRAPE ISOLATE BAG 20X20 STRL (DRAPES)
DRAPE MAG INST 16X20 L/F (DRAPES) ×4 IMPLANT
DRAPE TABLE BACK 80X90 (DRAPES) ×4 IMPLANT
DRESSING SURGICEL FIBRLLR 1X2 (HEMOSTASIS) IMPLANT
DRSG OPSITE POSTOP 4X14 (GAUZE/BANDAGES/DRESSINGS) ×2 IMPLANT
DRSG OPSITE POSTOP 4X8 (GAUZE/BANDAGES/DRESSINGS) ×4 IMPLANT
DRSG SURGICEL FIBRILLAR 1X2 (HEMOSTASIS) ×8
DRSG TEGADERM 6X8 (GAUZE/BANDAGES/DRESSINGS) ×2 IMPLANT
DURAPREP 26ML APPLICATOR (WOUND CARE) ×8 IMPLANT
ELECT CAUTERY BLADE 6.4 (BLADE) ×8 IMPLANT
ELECT REM PT RETURN 9FT ADLT (ELECTROSURGICAL) ×8
ELECTRODE REM PT RTRN 9FT ADLT (ELECTROSURGICAL) ×4 IMPLANT
GAUZE 4X4 16PLY RFD (DISPOSABLE) ×2 IMPLANT
GAUZE SPONGE 4X4 12PLY STRL (GAUZE/BANDAGES/DRESSINGS) ×2 IMPLANT
GLIDEWIRE ADV .035X180CM (WIRE) ×2 IMPLANT
GLOVE BIO SURGEON STRL SZ7 (GLOVE) ×22 IMPLANT
GLOVE INDICATOR 7.5 STRL GRN (GLOVE) ×4 IMPLANT
GOWN STRL REUS W/ TWL LRG LVL3 (GOWN DISPOSABLE) ×6 IMPLANT
GOWN STRL REUS W/ TWL XL LVL3 (GOWN DISPOSABLE) ×4 IMPLANT
GOWN STRL REUS W/TWL LRG LVL3 (GOWN DISPOSABLE) ×4
GOWN STRL REUS W/TWL XL LVL3 (GOWN DISPOSABLE) ×4
GRAFT VASC HEMAGARD 12X6 BIF (Vascular Products) ×2 IMPLANT
HANDLE YANKAUER SUCT BULB TIP (MISCELLANEOUS) ×4 IMPLANT
HEMOSTAT SURGICEL 2X14 (HEMOSTASIS) ×4 IMPLANT
HEMOSTAT SURGICEL 2X3 (HEMOSTASIS) ×4 IMPLANT
IV CONNECTOR ONE LINK NDLESS (IV SETS) ×12 IMPLANT
KIT CATH CVC 3 LUMEN 7FR 8IN (MISCELLANEOUS) ×4 IMPLANT
KIT TURNOVER KIT A (KITS) ×4 IMPLANT
LABEL OR SOLS (LABEL) ×4 IMPLANT
LOOP RED MAXI  1X406MM (MISCELLANEOUS) ×12
LOOP VESSEL MAXI 1X406 RED (MISCELLANEOUS) ×8 IMPLANT
LOOP VESSEL MINI 0.8X406 BLUE (MISCELLANEOUS) IMPLANT
LOOPS BLUE MINI 0.8X406MM (MISCELLANEOUS) ×6
NDL FILTER BLUNT 18X1 1/2 (NEEDLE) ×2 IMPLANT
NEEDLE FILTER BLUNT 18X 1/2SAF (NEEDLE) ×2
NEEDLE FILTER BLUNT 18X1 1/2 (NEEDLE) ×2 IMPLANT
NS IRRIG 500ML POUR BTL (IV SOLUTION) ×4 IMPLANT
PACK ANGIOGRAPHY (CUSTOM PROCEDURE TRAY) ×2 IMPLANT
PACK BASIN MAJOR ARMC (MISCELLANEOUS) ×4 IMPLANT
PACK UNIVERSAL (MISCELLANEOUS) ×4 IMPLANT
PENCIL ELECTRO HAND CTR (MISCELLANEOUS) ×4 IMPLANT
RETAINER VISCERA MED (MISCELLANEOUS) ×4 IMPLANT
SAVER CELL COLL SVCS (MISCELLANEOUS) IMPLANT
SHEATH BRITE TIP 6FRX11 (SHEATH) ×2 IMPLANT
SHEATH BRITE TIP 6FRX5.5 (SHEATH) ×2 IMPLANT
SPONGE LAP 18X18 RF (DISPOSABLE) ×4 IMPLANT
SPONGE LAP 18X36 RFD (DISPOSABLE) ×4 IMPLANT
STAPLER SKIN PROX 35W (STAPLE) ×8 IMPLANT
SUT MNCRL 4-0 (SUTURE) ×4
SUT MNCRL 4-0 27XMFL (SUTURE) ×4
SUT PDS AB 1 TP1 96 (SUTURE) ×12 IMPLANT
SUT PROLENE 2 0 SH DA (SUTURE) ×4 IMPLANT
SUT PROLENE 3 0 SH DA (SUTURE) ×14 IMPLANT
SUT PROLENE 5 0 RB 1 DA (SUTURE) ×12 IMPLANT
SUT PROLENE 6 0 BV (SUTURE) ×30 IMPLANT
SUT PROLENE 7 0 BV 1 (SUTURE) ×16 IMPLANT
SUT SILK 2 0 (SUTURE) ×2
SUT SILK 2 0 SH (SUTURE) ×4 IMPLANT
SUT SILK 2-0 18XBRD TIE 12 (SUTURE) ×2 IMPLANT
SUT SILK 3 0 (SUTURE) ×2
SUT SILK 3-0 18XBRD TIE 12 (SUTURE) ×2 IMPLANT
SUT SILK 4 0 (SUTURE) ×2
SUT SILK 4-0 18XBRD TIE 12 (SUTURE) ×2 IMPLANT
SUT VIC AB 2-0 CT1 27 (SUTURE) ×12
SUT VIC AB 2-0 CT1 TAPERPNT 27 (SUTURE) ×8 IMPLANT
SUT VICRYL+ 3-0 36IN CT-1 (SUTURE) ×12 IMPLANT
SUTURE MNCRL 4-0 27XMF (SUTURE) ×4 IMPLANT
SYR 20CC LL (SYRINGE) ×4 IMPLANT
SYR 3ML LL SCALE MARK (SYRINGE) ×4 IMPLANT
SYR BULB IRRIG 60ML STRL (SYRINGE) ×4 IMPLANT
TAPE UMBIL 1/8X18 RADIOPA (MISCELLANEOUS) ×4 IMPLANT
TIME ADDITIONAL PER CELL SAVER (MISCELLANEOUS) IMPLANT
TOWEL OR 17X26 4PK STRL BLUE (TOWEL DISPOSABLE) ×2 IMPLANT
TRAY FOLEY MTR SLVR 16FR STAT (SET/KITS/TRAYS/PACK) ×4 IMPLANT

## 2017-11-13 NOTE — Anesthesia Procedure Notes (Signed)
Arterial Line Insertion Start/End10/16/2019 7:45 AM, 11/13/2017 7:50 AM Performed by: Rosaria Ferries, MD, anesthesiologist  Patient location: Pre-op. Preanesthetic checklist: patient identified, IV checked, site marked, risks and benefits discussed, surgical consent, monitors and equipment checked, pre-op evaluation, timeout performed and anesthesia consent Right, radial was placed Catheter size: 20 Fr Hand hygiene performed  and maximum sterile barriers used  Allen's test indicative of satisfactory collateral circulation Attempts: 1 Procedure performed using ultrasound guided technique. Ultrasound Notes:anatomy identified, needle tip was noted to be adjacent to the nerve/plexus identified and no ultrasound evidence of intravascular and/or intraneural injection Following insertion, dressing applied. Post procedure assessment: normal and unchanged  Patient tolerated the procedure well with no immediate complications.

## 2017-11-13 NOTE — H&P (Signed)
[facility] History & Physical Update  The patient was interviewed and re-examined.  The patient's previous History and Physical has been reviewed and is unchanged.  There is no change in the plan of care. We plan to proceed with the scheduled procedure.  Festus Barren, MD  11/13/2017, 7:21 AM

## 2017-11-13 NOTE — Anesthesia Post-op Follow-up Note (Signed)
Anesthesia QCDR form completed.        

## 2017-11-13 NOTE — Anesthesia Procedure Notes (Signed)
Procedure Name: Intubation Date/Time: 11/13/2017 7:43 AM Performed by: Annice Needy, MD Pre-anesthesia Checklist: Patient identified, Emergency Drugs available, Suction available, Patient being monitored and Timeout performed Patient Re-evaluated:Patient Re-evaluated prior to induction Oxygen Delivery Method: Circle system utilized Preoxygenation: Pre-oxygenation with 100% oxygen Induction Type: IV induction Ventilation: Mask ventilation without difficulty Laryngoscope Size: Miller and 3 Grade View: Grade I Tube type: Oral Tube size: 7.5 mm Number of attempts: 1 Airway Equipment and Method: Stylet Placement Confirmation: ETT inserted through vocal cords under direct vision,  positive ETCO2 and breath sounds checked- equal and bilateral Secured at: 22 cm Tube secured with: Tape Dental Injury: Teeth and Oropharynx as per pre-operative assessment

## 2017-11-13 NOTE — Op Note (Signed)
OPERATIVE NOTE   PROCEDURE:    1.  Aortobifemoral bypass with 12 mm diameter proximal 6 mm diameter distal bifurcated Dacron graft 2.   Left common femoral, profunda femoris, and superficial femoral artery endarterectomies 3.   Right common femoral, profunda femoris, and superficial femoral artery endarterectomies 4.   Aortic endarterectomy 5.   Catheter placement into right popliteal artery from right femoral approach 6.    Right lower extremity angiogram   PRE-OPERATIVE DIAGNOSIS: 1.Atherosclerotic occlusive disease bilateral lower extremities with rest pain on the left and ulceration on the right 2. Aortic atherosclerosis   POST-OPERATIVE DIAGNOSIS: Same  SURGEON: Festus Barren, MD  CO-surgeon:  Levora Dredge, MD  ANESTHESIA:  general  ESTIMATED BLOOD LOSS: 250 cc  FINDING(S): 1.  Aortoiliac disease, significant plaque in bilateral common femoral, profunda femoris, and superficial femoral arteries  SPECIMEN(S):  Bilateral common femoral, profunda femoris, and superficial femoral artery plaque. Aortic plaque  INDICATIONS:    Patient presents with rest pain of the left leg and ulceration on the right leg with found peripheral arterial disease including aorta and iliac artery occlusions and bilateral SFA occlusions.  Aortobifemoral bypass is planned for revascularization for limb salvage.   Consideration for treatment of his right SFA occlusion is also being planned due to the ulceration on the right foot.  The risks and benefits as well as alternative therapies including intervention were reviewed in detail all questions were answered the patient agrees to proceed with surgery. Co-surgeons are used to expedite the procedure and reduce operative time as bilateral work needs to be done.  DESCRIPTION: After obtaining full informed written consent, the patient was brought back to the operating room and placed supine upon the operating table.  The patient received IV antibiotics prior  to induction.  After obtaining adequate anesthesia, the patient was prepped and draped in the standard fashion appropriate time out is called.  Initially a central line is placed and this will be dictated as a separate note.  With myself working on the right and Dr. Gilda Crease working on the left we began by dissecting out the femoral arteries on each side. Vertical incisions were created overlying both femoral arteries. The common femoral artery proximally, and superficial femoral artery, and primary profunda femoris artery branches were encircled with vessel loops and prepared for control. Both femoral arteries were found to have significant plaque from the common femoral artery into the profunda and superficial femoral arteries.  The midline incision is then created and the dissection carried down to expose the fascia. The peritoneal cavity is entered without difficulty just below the xiphoid process. As the incision is extended to the supraumbilical area. The omentum was then reflected superiorly and the small intestine is swept into the right gutter. Small intestine was then packed away with a large laparotomy pad which is moistened with saline.  The Omni-Tract retractor was then used to help facilitate our exposure.  The retroperitoneum is then opened along the midline overlying the pulse which is palpable at the level of the duodenum. The inferior mesenteric vein is identified and skeletonized so that he is mobilized and able to be retracted superiorly and left laterally out of the field. The renal vein is then identified. At this point the Omni-Tract is positioned to allow full exposure of the aorta.  The retroperitoneal dissection is then carried inferiorly past the palpable aortic bifurcation. Moving back superiorly the aorta is dissected circumferentially at the level of the renal vein it is soft at this  level and acceptable for clamp although it was known from the CT scan that there was plaque at  this level and an aortic endarterectomy would be necessary to allow appropriate inflow. It is then dissected along its right side taking care not to injure the vena cava. The dissection is then carried down to the aortic bifurcation and right common iliac artery . Attention is then turned to the left common iliac which is dissected out and prepared for control.   At this point we proceeded with the tunneling maneuvers taking care to stay below the ureters which were identified and protected from harm. Blunt dissection was used from the femoral incision up to the retroperitoneum and an umbilical tape was brought through the tunnel on each side and would later be exchanged for the graft.   5000 units of heparin was given and allowed circulate for 5 minutes.  An additional 2000 units of heparin were given later in the procedure.  An 12 mm x 6 mm  Dacron bifurcated graft is then selected and rehydrated on the back table. With myself working on the right side and Dr. Gilda Crease working on the left side the graft will be implanted.  The aorta was clamped just below the level of the left renal and an aortotomy is made with 11 blade and extended with Potts scissors the aortic contents are then removed under direct visualization. There was some significant thick aortic plaque that was removed.  The freer elevator was used to create an endarterectomy plane distal to the aortotomy as well as proximal to the aortotomy up to the clamp which is the base of the left renal artery.  This was taken down to just above the IMA.  The IMA had been looped for control.  All loose contents were removed. The aorta was forward flushed. The Dacron graft was then beveled and applied in an end graft to end aortic anastomosis using running 3-0 Prolene. Flushing maneuvers were performed and flow was established back to the distal aorta the graft is clamped just above the suture line.    Then, using the umbilical tape as a guide, the left  limb of the graft was tunneled in the retroperitoneal  to the left femoral incision. Care was taken to keep orientation with the line on the Dacron graft upward.  Attention is then turned to the left femoral artery.  An arteriotomy is made with 11 blade and extended with Potts scissors in the common femoral artery and carried down onto the first 2-3 cm of the profunda femoris artery. An endarterectomy was then performed. The [facility] was used to create a plane. The proximal endpoint was cut flush with tenotomy scissors. This was in the mid common femoral artery. An eversion endarterectomy was then performed for the first 2-3 cm of the superficial femoral artery. Good backbleeding was then seen. The distal endpoint of the profunda femoris endarterectomy was created with gentle traction and the distal endpoint was tacked down with a series of 7-0 Prolene sutures in the profunda femoris artery.  The left limb of the graft is then approximated to the arteriotomy, trimmed to an appropriate length and beveled and an end graft to side femoral artery anastomosis is fashioned including the graft over the origin of the profunda femoris artery over its first 2-3 cm.  6-0 Prolene is used to sew the anastomosis. Flushing maneuvers were performed and flow was established first to the profunda femoris artery and then to the superficial femoral  artery. Easily palpable pulses are noted well beyond the anastomosis.  The right femoral artery is then addressed. Arteriotomy is made in the common femoral artery and extended down into the first 3 to 4 cm of the superficial femoral artery. Similarly, an endarterectomy was performed with the [facility]. The proximal endpoint was cut flush with tenotomy scissors in the mid common femoral artery.  The profunda femoris artery was addressed and treated with an eversion endarterectomy and this was performed with a hemostat and gentle traction.  The endpoint of the profunda  femoris artery endarterectomy which was about 1-2 cm into the artery was tacked down with four 7-0 prolene sutures.  Previously, the arteriotomy was carried down onto the superficial femoral artery and the endarterectomy was continued to this point. The distal endpoint was still diseased.  We knew he had a long segment right SFA occlusion previously.  I then placed a 6 French sheath into the SFA and attempted to cross the occlusion with a Kumpe catheter and an advantage wire.  The wire and catheter were advanced down to the popliteal artery, but despite multiple attempts I was never able to gain intraluminal access below the occlusion in the popliteal artery.  At this point, we elected to complete the anastomosis and then try once more once we had forward flow.  The graft was then cut and beveled to match the arteriotomy after tunneling this through the retroperitoneum. The anastomosis created in an end to side fashion with a 6-0 Prolene suture, and this was in the mid common femoral artery down to the first few centimeters of the superficial femoral artery in an end-to-side fashion. Flushing maneuvers were performed and flow was reestablished to the femoral vessels. Excellent pulses noted in the profunda femoris artery below the femoral anastomosis. A 6 French sheath was then placed into the hood of the graft and I again attempted to cross the occlusion in the right SFA and popliteal arteries.  Imaging performed through the sheath had shown there was reconstitution of the popliteal artery at and just below the level of the knee with two-vessel runoff through both the anterior tibial and posterior tibial arteries into the foot.  Again, despite multiple attempts with the advantage wire and Kumpe catheter I was never able to cross the occlusion and regain intraluminal flow in the popliteal artery.  The patient has had a long surgery and at this point I felt it was in his best interest to complete the procedure today  and see how much improvement he had and then consider subsequent attempts at crossing the occlusion at a later date.  The retroperitoneal tissues are then irrigated with sterile saline and inspected for hemostasis Evicel with Surgicel is placed and the retroperitoneal tissues are reapproximated using running 0 Vicryl suture. The viscera was returned to its anatomic location and subsequently the fascia is closed with looped #1 PDS skin is closed with staples.  Surgicel and Evicel topical hemostatic agents were placed in the femoral incisions and hemostasis was complete. The femoral incisions were then closed in a layered fashion with 2 layers of 2-0 Vicryl, a layer of 3-0 Vicryl, and 4-0 Monocryl for the skin closure. Dermabond and sterile dressing were then placed over all incisions.  The patient was then awakened from anesthesia and taken to the recovery room in stable condition having tolerated the procedure well.  COMPLICATIONS: None  CONDITION: Stable   This note was created with Dragon Medical transcription system. Any errors in dictation  are purely unintentional.

## 2017-11-13 NOTE — Progress Notes (Signed)
Informed Dr. Wyn Quaker pedal pulses not palpable and could not be heard with Doppler.  Dr. Wyn Quaker able to  palpated bilateral femoral pulses

## 2017-11-13 NOTE — Op Note (Signed)
OPERATIVE NOTE   PROCEDURE: 1. Aortobifemoral bypass grafting with 12 x 6 bifurcated Dacron graft 2.   Right common femoral endarterectomy 2. Right profunda femoris endarterectomy 3. Right superficial femoral endarterectomy 4. Left common femoral endarterectomy 5. Left profunda femoris endarterectomy 6. Left superficial femoral endarterectomy 7. Aortic endarterectomy 8. Introduction catheter into right popliteal artery from right femoral approach antegrade 9. Right lower extremity angiography  PRE-OPERATIVE DIAGNOSIS: 1.Atherosclerotic occlusive disease bilateral lower extremities with rest pain and ulceration of the right foot 2. Aortic atherosclerosis  POST-OPERATIVE DIAGNOSIS: Same  SURGEON: Jesilyn Easom, Latina Craver CO-surgeon:  Festus Barren, M.D.   ANESTHESIA:  general  ESTIMATED BLOOD LOSS: 250 cc  FINDING(S): 1.  Severe atherosclerotic changes to the common femoral profunda femoris and superficial femoral arteries bilaterally. The dissection and subsequently endarterectomy was carried out to the tertiary and quaternary branches of the deep femoral.  SPECIMEN(S):  Plaque bilateral femoral artery systems as well as plaque from the aorta  INDICATIONS:     Kyle Gordon is a 58 y.o. y.o. male who presents with ulcers of the right foot and rest pain bilaterally.  The risks and benefits as well as alternative therapies including intervention were reviewed in detail all questions were answered the patient agrees to proceed with surgery.  DESCRIPTION: After obtaining full informed written consent, the patient was brought back to the operating room and placed supine upon the operating table.  The patient received IV antibiotics prior to induction.  After obtaining adequate anesthesia, the patient was prepped and draped in the standard fashion appropriate time out is called.  Initially a central line is placed and this will be dictated as a separate note.  Co-surgeons are required  because this is a complex bilateral procedure with work being performed simultaneously from both the patient's right and left sides.  This also expedites the procedure making a shorter operative time reducing complications and improving patient safety.  Working with myself on the patient's left side with the patient and Dr. Wyn Quaker on the patient's right side, vertical incisions are made in both groins and the dissection is carried down to expose the femoral sheath. Femoral sheath is entered and the common femoral arteries identified. Dissection is then carried proximally to a level 1-2 cm above the ileo-inguinal ligament. The proximal common femoral artery is then looped with a Silastic vessel loop. The superficial femoral artery is then exposed for a distance of approximately 3-4 cm and looped with a Silastic vessel loop. The profunda femoris artery is then exposed working down to the quaternary branches using multiple blue Silastic Vesseloops each individual branches then looped with Silastic vessel loop. This is accomplished on both right and left sides. Blunt dissection is then used in the anterior wall of the artery to fashion a tunnel for the bypass graft. Attention is then turned to the abdomen  The midline incision is then created and the dissection carried down to expose the fascia. The peritoneal cavity is entered without difficulty just below the xiphoid process. As the incision is extended to the pubis. The omentum was then reflected superiorly and the small intestine is swept into the right gutter. Small intestine was then packed away with a large laparotomy pad which is moistened with saline.  The Omni-Tract retractor was then used to help facilitate our exposure.  The retroperitoneum is then opened along the midline overlying the pulse which is palpable at the level of the duodenum. The inferior mesenteric vein is identified and skeletonized so that  he is mobilized and able to be retracted  superiorly and left laterally out of the field. The renal vein is then identified. At this point the Omni-Tract is positioned to allow full exposure of the aorta.  The retroperitoneal dissection is then carried inferiorly past the palpable aortic bifurcation. Moving back superiorly the aorta is dissected circumferentially at the level of the renal vein it is soft at this level and acceptable for clamp. It is then dissected along its right side taking care not to injure the vena cava. The dissection is then carried down to the right common iliac. Attention is then turned to the left common iliac which is dissected. Tunnels were then fashioned using blunt dissection along the anterior margin of the iliac arteries extending out to the groin incisions. And subsequently umbilical tapes are placed both right and left side.  5000 units of heparin was given and allowed circulate for 5 minutes. An 12 x 6 Dacron bifurcated graft is then selected and rehydrated on the back table. With myself working on the left side and Dr. Wyn Quaker working on the right side the graft will be implanted.  The aorta was clamped just below the level of the left renal and an aortotomy is made with 11 blade and extended with Potts scissors the aortic contents are then removed under direct visualization.  Formal endarterectomy of the aorta at the level of the renal arteries was required as the aorta was so calcified and hardened that it was not syllable.  This was performed under direct visualization with a freer elevator and the plaque was collected and passed off the field as specimen.  The aorta was forward flushed. The Dacron graft was then beveled and applied in an end graft to end aortic anastomosis using running 4-0 Prolene. Flushing maneuvers were performed and flow was established back to the distal aorta the graft is clamped just above the suture line.  The distal aorta was oversewn with a running 4-0 Prolene.  Working on the left side  first the right limb of the graft is pulled through the tunnel. It is then reflected superiorly out of the way.  In a similar fashion the right limb of the graft was pulled through the tunnel created with blunt dissection and reflected superiorly.  Attention is then turned to the left common femoral artery.  An arteriotomy is made with 11 blade and extended with Potts scissors onto the profunda femoris. Endarterectomy is then performed with a Therapist, nutritional under direct visualization extending from the common femoral down into the profunda femoris to the level of the fourth order branches as noted above in the description of the dissection. The left superficial femoral artery is then treated with endarterectomy using the eversion technique extending the endarterectomy to approximately 3 cm distal to the origin  The left limb of the graft is then approximated to the arteriotomy trimmed to an appropriate bevel and an end graft to side common and profunda femoris artery anastomosis is fashioned. 5-0 Prolene is used to sew the anastomosis. Flushing maneuvers were performed and flow was established first to the common femoral in a retrograde fashion and then the profunda femoris and superficial femoral. Easily palpable pulses are noted in the profunda femoris distal to the anastomosis. The graft itself has an excellent pulse.  Attention is then turned to the right common femoral artery.  An arteriotomy is made with 11 blade and extended with Potts scissors onto the profunda femoris. Endarterectomy is then performed with  a Therapist, nutritional under direct visualization extending from the common femoral down into the profunda femoris to the level of the fourth order branches as noted above in the description of the dissection. 7-0 Prolene sutures are used to tack the distal intimal edge in an interrupted fashion.  At this point with Dr. Wyn Quaker working as the primary surgeon attempts were made to cross the right superficial  femoral artery occlusion.  A 6 French sheath was placed directly into the SFA.  A variety of different wires and catheters were then utilized.  Hand-injection of contrast was utilized to create imaging of the right lower extremity arterial system including distal runoff to the foot.  This showed occlusion of the SFA with reconstitution of the popliteal essentially at the knee.  Trifurcation appears to be patent and it does appear that the posterior tibial and dorsalis pedis both are patent down to the foot.  However, as noted above successful reentry into the true lumen at Hunter's canal was not achievable and therefore we elected to complete the aortobifemoral bypass and plan for SFA reconstruction at a later date.  The right limb of the graft is then approximated to the arteriotomy trimmed to an appropriate bevel and an end graft to side common femoral and profunda femoris artery anastomosis is fashioned. 5-0 Prolene is used to sew the anastomosis. Flushing maneuvers were performed and flow was established first to the common femoral in a retrograde fashion and then the profunda femoris and superficial femoral arteries. Easily palpable pulses are noted in the profunda femoris distal to the anastomosis. The graft itself has an excellent pulse.  The retroperitoneal tissues are then irrigated with sterile saline and inspected for hemostasis Evicel with Surgicel is placed and the retroperitoneal tissues are reapproximated using running 0 Vicryl suture. The viscera was returned to its anatomic location and subsequently the fascia is closed with looped #1 PDS.  Attention is then turned to both groin incisions which were irrigated AdvaSeal and Surgicel are placed and the groins are reapproximated using a total of 4 layers of Vicryl with 2 layers of 2-0 Vicryl in a running fashion followed by 2 layers of 3-0 Vicryl in a running fashion followed by 4-0 Monocryl subcuticular. The abdominal incision is closed with 4-0  Monocryl subcuticular.   COMPLICATIONS: None  CONDITION: Almon Register 08/04/2014 4:18 PM

## 2017-11-13 NOTE — Transfer of Care (Signed)
Immediate Anesthesia Transfer of Care Note  Patient: Kyle Gordon  Procedure(s) Performed: AORTA BIFEMORAL BYPASS GRAFT (Bilateral ) INSERTION OF ILIAC STENT (SFA STENT PLACEMENT ) (Right )  Patient Location: PACU  Anesthesia Type:General  Level of Consciousness: sedated  Airway & Oxygen Therapy: Patient Spontanous Breathing and Patient connected to face mask oxygen  Post-op Assessment: Report given to RN and Post -op Vital signs reviewed and stable  Post vital signs: stable  Last Vitals:  Vitals Value Taken Time  BP 128/66 11/13/2017  1:59 PM  Temp    Pulse 63 11/13/2017  2:07 PM  Resp 15 11/13/2017  2:07 PM  SpO2 100 % 11/13/2017  2:07 PM  Vitals shown include unvalidated device data.  Last Pain:  Vitals:   11/13/17 0637  TempSrc: Tympanic  PainSc:          Complications: No apparent anesthesia complications

## 2017-11-13 NOTE — Anesthesia Preprocedure Evaluation (Signed)
Anesthesia Evaluation  Patient identified by MRN, date of birth, ID band Patient awake    Reviewed: Allergy & Precautions, H&P , NPO status , Patient's Chart, lab work & pertinent test results  Airway Mallampati: II  TM Distance: >3 FB Neck ROM: full    Dental  (+) Chipped, Poor Dentition   Pulmonary neg shortness of breath, COPD, Current Smoker,           Cardiovascular Exercise Tolerance: Good hypertension, (-) angina+ Peripheral Vascular Disease  (-) Past MI and (-) DOE      Neuro/Psych negative neurological ROS  negative psych ROS   GI/Hepatic negative GI ROS, Neg liver ROS, neg GERD  ,  Endo/Other  negative endocrine ROS  Renal/GU      Musculoskeletal  (+) Arthritis ,   Abdominal   Peds  Hematology negative hematology ROS (+)   Anesthesia Other Findings Past Medical History: No date: Arthritis No date: Hypertension     Comment:  no meds No date: Peripheral vascular disease (HCC)     Comment:  pad  Past Surgical History: 10/14/2017: LOWER EXTREMITY ANGIOGRAPHY; Right     Comment:  Procedure: LOWER EXTREMITY ANGIOGRAPHY;  Surgeon: Annice Needy, MD;  Location: [facility] INVASIVE CV LAB;  Service:               Cardiovascular;  Laterality: Right; No date: MOUTH SURGERY  BMI    Body Mass Index:  18.79 kg/m      Reproductive/Obstetrics negative OB ROS                             Anesthesia Physical Anesthesia Plan  ASA: III  Anesthesia Plan: General ETT   Post-op Pain Management:    Induction: Intravenous  PONV Risk Score and Plan: Ondansetron, Dexamethasone and Midazolam  Airway Management Planned: Oral ETT  Additional Equipment: Arterial line and CVP  Intra-op Plan:   Post-operative Plan: Extubation in OR  Informed Consent: I have reviewed the patients History and Physical, chart, labs and discussed the procedure including the risks, benefits and  alternatives for the proposed anesthesia with the patient or authorized representative who has indicated his/her understanding and acceptance.   Dental Advisory Given  Plan Discussed with: Anesthesiologist, CRNA and Surgeon  Anesthesia Plan Comments: (Patient consented for risks of anesthesia including but not limited to:  - adverse reactions to medications - damage to teeth, lips or other oral mucosa - sore throat or hoarseness - Damage to heart, brain, lungs or loss of life  Patient voiced understanding.)        Anesthesia Quick Evaluation

## 2017-11-14 ENCOUNTER — Encounter: Payer: Self-pay | Admitting: Vascular Surgery

## 2017-11-14 DIAGNOSIS — Z452 Encounter for adjustment and management of vascular access device: Secondary | ICD-10-CM

## 2017-11-14 LAB — CBC
HCT: 36.6 % — ABNORMAL LOW (ref 39.0–52.0)
HEMOGLOBIN: 12.4 g/dL — AB (ref 13.0–17.0)
MCH: 37.3 pg — ABNORMAL HIGH (ref 26.0–34.0)
MCHC: 33.9 g/dL (ref 30.0–36.0)
MCV: 110.2 fL — ABNORMAL HIGH (ref 80.0–100.0)
PLATELETS: 156 10*3/uL (ref 150–400)
RBC: 3.32 MIL/uL — AB (ref 4.22–5.81)
RDW: 12.1 % (ref 11.5–15.5)
WBC: 9.3 10*3/uL (ref 4.0–10.5)
nRBC: 0 % (ref 0.0–0.2)

## 2017-11-14 LAB — TYPE AND SCREEN
ABO/RH(D): A POS
Antibody Screen: NEGATIVE
Unit division: 0
Unit division: 0

## 2017-11-14 LAB — BPAM RBC
BLOOD PRODUCT EXPIRATION DATE: 201911082359
Blood Product Expiration Date: 201911082359
Unit Type and Rh: 5100
Unit Type and Rh: 5100

## 2017-11-14 LAB — BASIC METABOLIC PANEL
Anion gap: 6 (ref 5–15)
BUN: 6 mg/dL (ref 6–20)
CHLORIDE: 106 mmol/L (ref 98–111)
CO2: 25 mmol/L (ref 22–32)
CREATININE: 0.67 mg/dL (ref 0.61–1.24)
Calcium: 8.2 mg/dL — ABNORMAL LOW (ref 8.9–10.3)
GFR calc Af Amer: >60 mL/min (ref >60)
Glucose, Bld: 152 mg/dL — ABNORMAL HIGH (ref 70–99)
Potassium: 4.1 mmol/L (ref 3.5–5.1)
SODIUM: 137 mmol/L (ref 135–145)

## 2017-11-14 MED ORDER — KETOROLAC TROMETHAMINE 30 MG/ML IJ SOLN
30.0000 mg | Freq: Four times a day (QID) | INTRAMUSCULAR | Status: DC
  Administered 2017-11-14 – 2017-11-18 (×15): 30 mg via INTRAVENOUS
  Filled 2017-11-14 (×15): qty 1

## 2017-11-14 MED ORDER — INFLUENZA VAC SPLIT QUAD 0.5 ML IM SUSY: 0.5000 mL | PREFILLED_SYRINGE | INTRAMUSCULAR | Status: DC

## 2017-11-14 NOTE — Care Management Note (Signed)
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  Patient reports that he does not have a PCP and does not have any insurance.  I spoke with the patient about [facility] and he agreed to a referral- I left the application for [facility] in the chart, the patient can fill it out at a later time- also informed patient of [facility] across the street from the hospital the application is for [facility] both.  He reports that he currently does not take any prescriptions and does not have a pharmacy that he uses.  He does drive and has no issues with transportation.  Patient lives at home with his wife.  He reports that his wife recently had a stroke and she is currently at home.  Equipment available at the home is a cane, walker, wheelchair, and shower stool with a back.  I will consult financial aid to come and assess the patient.     Action/Plan: Will continue to follow patient and assess needs as he progresses.  He has a PT consult in but they were unable to see him today because his clinical condition did not permit therapy.    Expected Discharge Date:                  Expected Discharge Plan:  Home/Self Care  In-House Referral:     Discharge planning Services  CM Consult, Indigent Health Clinic, Medication Assistance  Post Acute Care Choice:    Choice offered to:     DME Arranged:    DME Agency:     HH Arranged:    HH Agency:     Status of Service:  In process, will continue to follow  If discussed at Long Length of Stay Meetings, dates discussed:    Additional Comments:  Allayne Butcher, RN 11/14/2017, 2:11 PM

## 2017-11-14 NOTE — Progress Notes (Signed)
PT Cancellation Note  Patient Details Name: Kyle Gordon MRN: [id number] DOB: [birth year]   Cancelled Treatment:    Reason Eval/Treat Not Completed: Medical issues which prohibited therapy Spoke with nursing this AM and again early PM.  Pt has still had elevated BP with need for increase in meds, has also been complaining of a lot of pain (belly even more so than LEs) and has been needing consistent and significant morphine.  PT will be held today, will try back when pt is more medically appropriate.  Malachi Pro, DPT 11/14/2017, 1:28 PM

## 2017-11-14 NOTE — Anesthesia Postprocedure Evaluation (Signed)
Anesthesia Post Note  Patient: Kyle Gordon  Procedure(s) Performed: AORTA BIFEMORAL BYPASS GRAFT (Bilateral ) INSERTION OF ILIAC STENT (SFA STENT PLACEMENT ) (Right )  Patient location during evaluation: SICU Anesthesia Type: General Level of consciousness: awake Pain management: pain level controlled Vital Signs Assessment: post-procedure vital signs reviewed and stable Cardiovascular status: stable Postop Assessment: no apparent nausea or vomiting Anesthetic complications: no     Last Vitals:  Vitals:   11/14/17 0600 11/14/17 0700  BP: (!) 159/88 (!) 168/92  Pulse: 93 93  Resp:    Temp:    SpO2: 97% 97%    Last Pain:  Vitals:   11/14/17 0523  TempSrc:   PainSc: Asleep                 Jules Schick

## 2017-11-14 NOTE — Progress Notes (Signed)
[facility]  Daily Progress Note   Subjective: 1 Day Post-Op: Aortobifemoral bypass with 12 mm diameter proximal 6 mm diameter distal bifurcated Dacron graft, Left common femoral, profunda femoris, and superficial femoral artery endarterectomies, Right common femoral, profunda femoris, and superficial femoral artery endarterectomies, Aortic endarterectomy, Catheter placement into right popliteal artery from right femoral approach with Right lower extremity angiogram.  No issues night of surgery.  Patient is complaining of incisional pain.  Denies any nausea or vomiting.  NG tube is in place.  Patient has passed his trial of void after his Foley was removed this morning.  Patient has not experienced any flatus.  Patient has not been out of bed yet.  Objective: Vitals:   11/14/17 1200 11/14/17 1300 11/14/17 1330 11/14/17 1400  BP: (!) 167/93 (!) 161/98  136/85  Pulse: (!) 101 (!) 104 98 92  Resp:      Temp:      TempSrc:      SpO2: 96% 97% 97% 95%  Weight:      Height:        Intake/Output Summary (Last 24 hours) at 11/14/2017 1641 Last data filed at 11/14/2017 1600 Gross per 24 hour  Intake 3707.3 ml  Output 1500 ml  Net 2207.3 ml   Physical Exam: A&Ox3, NAD CV: RRR Pulmonary: CTA Bilaterally Abdomen: Soft, tender to palpation, mild distention, decreased bowel sounds  NG: intact, sumping to suction. Non-bilious output.   Incision: OR dressing in place, clean and dry Groin: OR dressings intact. Clean and dry GU: foley removed Vascular:  Right lower extremity: thigh soft, calf soft. Hard to palpate pedal pulses but foot is warm.  Good capillary refill.  Left lower extremity: thigh soft, calf soft. Hard to palpate pedal pulses but foot is warm.  Good capillary refill.   Laboratory: CBC    Component Value Date/Time   WBC 9.3 11/14/2017 0446   HGB 12.4 (L) 11/14/2017 0446   HCT 36.6 (L) 11/14/2017 0446   PLT 156 11/14/2017 0446   BMET     Component Value Date/Time   NA 137 11/14/2017 0446   K 4.1 11/14/2017 0446   CL 106 11/14/2017 0446   CO2 25 11/14/2017 0446   GLUCOSE 152 (H) 11/14/2017 0446   BUN 6 11/14/2017 0446   CREATININE 0.67 11/14/2017 0446   CALCIUM 8.2 (L) 11/14/2017 0446   GFRNONAA >60 11/14/2017 0446   GFRAA >60 11/14/2017 0446   Assessment/Planning: The patient is a 57 year old male with severe aorto-iliac disease s/p aortobifemoral bypass POD#1 - Stable 1) Foley removed this AM. Patient passed trial of void. 2) NG intact. Sumping. Minimal non-bilious drainage. Explained to patient it will be removed when bowel functions returns. Patient is not passing flatus yet. 3) Added Toradol 30mg  q6 hours for pain control. Check BMP daily. 4) H&H stable 5) OOB, Ambulation, Physical therapy. Encouraged patient to ambulate with assistance.  Discussed with Dr. Wallis Mart Preslei Blakley PA-C 11/14/2017 4:41 PM

## 2017-11-15 LAB — BASIC METABOLIC PANEL
ANION GAP: 8 (ref 5–15)
BUN: <5 mg/dL — ABNORMAL LOW (ref 6–20)
CO2: 25 mmol/L (ref 22–32)
CREATININE: 0.61 mg/dL (ref 0.61–1.24)
Calcium: 8.2 mg/dL — ABNORMAL LOW (ref 8.9–10.3)
Chloride: 104 mmol/L (ref 98–111)
GLUCOSE: 106 mg/dL — AB (ref 70–99)
Potassium: 3.4 mmol/L — ABNORMAL LOW (ref 3.5–5.1)
Sodium: 137 mmol/L (ref 135–145)

## 2017-11-15 LAB — CBC
HEMATOCRIT: 31.6 % — AB (ref 39.0–52.0)
Hemoglobin: 10.9 g/dL — ABNORMAL LOW (ref 13.0–17.0)
MCH: 38.1 pg — ABNORMAL HIGH (ref 26.0–34.0)
MCHC: 34.5 g/dL (ref 30.0–36.0)
MCV: 110.5 fL — AB (ref 80.0–100.0)
NRBC: 0 % (ref 0.0–0.2)
Platelets: 144 10*3/uL — ABNORMAL LOW (ref 150–400)
RBC: 2.86 MIL/uL — ABNORMAL LOW (ref 4.22–5.81)
RDW: 12.2 % (ref 11.5–15.5)
WBC: 7.9 10*3/uL (ref 4.0–10.5)

## 2017-11-15 LAB — MAGNESIUM: Magnesium: 1.7 mg/dL (ref 1.7–2.4)

## 2017-11-15 LAB — SURGICAL PATHOLOGY

## 2017-11-15 MED ORDER — MAGNESIUM SULFATE 2 GM/50ML IV SOLN
2.0000 g | Freq: Once | INTRAVENOUS | Status: AC
  Administered 2017-11-15: 2 g via INTRAVENOUS
  Filled 2017-11-15: qty 50

## 2017-11-15 MED ORDER — POTASSIUM CHLORIDE 10 MEQ/100ML IV SOLN
10.0000 meq | INTRAVENOUS | Status: AC
  Administered 2017-11-15 (×5): 10 meq via INTRAVENOUS
  Filled 2017-11-15 (×6): qty 100

## 2017-11-15 MED ORDER — INFLUENZA VAC SPLIT QUAD 0.5 ML IM SUSY: 0.5000 mL | PREFILLED_SYRINGE | INTRAMUSCULAR | Status: DC

## 2017-11-15 MED ORDER — POTASSIUM CHLORIDE 10 MEQ/50ML IV SOLN: 10.0000 meq | Freq: Once | INTRAVENOUS | Status: DC

## 2017-11-15 MED ORDER — POTASSIUM CHLORIDE 10 MEQ/100ML IV SOLN
10.0000 meq | Freq: Once | INTRAVENOUS | Status: AC
  Administered 2017-11-15: 10 meq via INTRAVENOUS
  Filled 2017-11-15: qty 100

## 2017-11-15 MED ORDER — FUROSEMIDE 10 MG/ML IJ SOLN
20.0000 mg | Freq: Once | INTRAMUSCULAR | Status: AC
  Administered 2017-11-15: 20 mg via INTRAVENOUS
  Filled 2017-11-15: qty 2

## 2017-11-15 MED ORDER — POTASSIUM CHLORIDE CRYS ER 20 MEQ PO TBCR: 20.0000 meq | EXTENDED_RELEASE_TABLET | Freq: Two times a day (BID) | ORAL | Status: DC

## 2017-11-15 NOTE — Progress Notes (Signed)
Pt A-Line still in place, per verbal order from Dr. Wyn Quaker, ok to leave a-line in place while pt still on esmolol gtt.

## 2017-11-15 NOTE — Progress Notes (Signed)
[facility]  Daily Progress Note   Subjective  - 2 Days Post-Op  Doing well.  Sitting in chair. No bm.  Pain control adequate.  Objective Vitals:   11/15/17 0837 11/15/17 0900 11/15/17 1000 11/15/17 1100  BP:  (!) 156/89  (!) 134/121  Pulse: 93 99 100 98  Resp: (!) 21 20 18 16   Temp:      TempSrc:      SpO2: 96% 96% 97% 94%  Weight:      Height:        Intake/Output Summary (Last 24 hours) at 11/15/2017 1214 Last data filed at 11/15/2017 1130 Gross per 24 hour  Intake 4876.49 ml  Output 1325 ml  Net 3551.49 ml    PULM  CTAB CV  RRR VASC  Abdomen mildly distended, feet warm  Laboratory CBC    Component Value Date/Time   WBC 9.3 11/14/2017 0446   HGB 12.4 (L) 11/14/2017 0446   HCT 36.6 (L) 11/14/2017 0446   PLT 156 11/14/2017 0446    BMET    Component Value Date/Time   NA 137 11/15/2017 1025   K 3.4 (L) 11/15/2017 1025   CL 104 11/15/2017 1025   CO2 25 11/15/2017 1025   GLUCOSE 106 (H) 11/15/2017 1025   BUN <5 (L) 11/15/2017 1025   CREATININE 0.61 11/15/2017 1025   CALCIUM 8.2 (L) 11/15/2017 1025   GFRNONAA >60 11/15/2017 1025   GFRAA >60 11/15/2017 1025    Assessment/Planning: POD #2 s/p ABFBG and bilateral femoral endarterectomies   Doing well  D/C NGT  Lower IVF and give some Lasix and K      Kyle Gordon  11/15/2017, 12:14 PM

## 2017-11-15 NOTE — Evaluation (Signed)
Physical Therapy Evaluation Patient Details Name: Kyle Gordon MRN: [id number] DOB: 16-Feb-1960 Today's Date: 11/15/2017   History of Present Illness  Pt is a 57 y.o male presenting s/p aortobifemoral bipass after one year of progressive vascular pain. Pt has not used an AD however reports increased difficulty with ambulation over the last year. PMH significant for PVD, HTN, and arthritis.   Clinical Impression  Pt alert and pleasant upon arrival eager to begin mobility and ambulation. Pt demonstrating general weakness as compared to baseline likely attributed to recent aortobifemoral bipass. In supine BP in 160's/90's with RN in room approving continued treatment. Systolic blood pressure consistently in 160's t/o however diastolic increasing as high as 116 during ambulation with RN  adjusting esmolol IV drip as needed. Pt reporting brief dizziness in sitting however otherwise asymptomatic. Pt with excellent effort during bed mobility, transfers, and 15 feet of ambulation. 1 UE hand hold assist utilized for sit to stand and ambulation however pt with minimal reliance on support. Slow and steady gait with no LOB, no physical assist needed, and asymptomatic t/o. Pt would benefit from skilled PT via HHPT upon d/c and medical clearance to continue improvements in functional limitations relative to baseline.      Follow Up Recommendations Home health PT    Equipment Recommendations       Recommendations for Other Services       Precautions / Restrictions Precautions Precautions: Fall Restrictions Weight Bearing Restrictions: No      Mobility  Bed Mobility Overal bed mobility: Needs Assistance Bed Mobility: Supine to Sit     Supine to sit: Min assist     General bed mobility comments: Pt with excellent effort requiring only min assist to full achieve upright sitting with head of bed elevated to about 45 deg. No restrictions in movement due to pain, reports of dizziness upon sitting  resolving about a min after static sitting.   Transfers Overall transfer level: Needs assistance Equipment used: 1 person hand held assist Transfers: Sit to/from Stand Sit to Stand: Min guard         General transfer comment: Pt utilizing SPT hand for push off, min guard only for safety but no physical assist required to achieve standing, asymptomatic upon standing.   Ambulation/Gait Ambulation/Gait assistance: Min guard Gait Distance (Feet): 15 Feet Assistive device: 1 person hand held assist       General Gait Details: Pt eager to ambulate and able to ambulate safely 15 feet in room with minimal use of SPT hand for support, CGA for safety, no LOB, cautious and slow steps, however overall safe with good strength and balance.   Stairs            Wheelchair Mobility    Modified Rankin (Stroke Patients Only)       Balance Overall balance assessment: No apparent balance deficits (not formally assessed)                                           Pertinent Vitals/Pain Pain Assessment: No/denies pain    Home Living Family/patient expects to be discharged to:: Private residence Living Arrangements: Spouse/significant other Available Help at Discharge: Family Type of Home: House Home Access: Stairs to enter Entrance Stairs-Rails: None Entrance Stairs-Number of Steps: 2 Home Layout: One level Home Equipment: None      Prior Function Level of Independence: Independent  Comments: 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      Hand Dominance        Extremity/Trunk Assessment   Upper Extremity Assessment Upper Extremity Assessment: Generalized weakness(grossly >3/5 expected post op weakness compared to baseline)    Lower Extremity Assessment Lower Extremity Assessment: Generalized weakness(grossly >3/5 no limitations from pain expected post op weakness )       Communication    Communication: No difficulties  Cognition Arousal/Alertness: Awake/alert Behavior During Therapy: WFL for tasks assessed/performed Overall Cognitive Status: Within Functional Limits for tasks assessed                                        General Comments General comments (skin integrity, edema, etc.): BP 160's/90's in supine prior to activity, BP checked multiple times t/o activity diastolic consistently high.     Exercises     Assessment/Plan    PT Assessment Patient needs continued PT services  PT Problem List Decreased strength;Decreased activity tolerance;Decreased balance;Decreased safety awareness;Cardiopulmonary status limiting activity;Decreased mobility       PT Treatment Interventions DME instruction;Gait training;Stair training;Functional mobility training;Therapeutic activities;Therapeutic exercise;Patient/family education;Balance training;Modalities    PT Goals (Current goals can be found in the Care Plan section)  Acute Rehab PT Goals Patient Stated Goal: regain strength and independence PT Goal Formulation: With patient Time For Goal Achievement: 11/29/17 Potential to Achieve Goals: Good    Frequency Min 2X/week   Barriers to discharge        Co-evaluation               AM-PAC PT "6 Clicks" Daily Activity  Outcome Measure Difficulty turning over in bed (including adjusting bedclothes, sheets and blankets)?: A Little Difficulty moving from lying on back to sitting on the side of the bed? : Unable Difficulty sitting down on and standing up from a chair with arms (e.g., wheelchair, bedside commode, etc,.)?: A Little Help needed moving to and from a bed to chair (including a wheelchair)?: A Little Help needed walking in hospital room?: A Little Help needed climbing 3-5 steps with a railing? : A Little 6 Click Score: 16    End of Session Equipment Utilized During Treatment: Gait belt Activity Tolerance: Patient tolerated treatment  well Patient left: in chair;with call bell/phone within reach;with nursing/sitter in room Nurse Communication: Mobility status PT Visit Diagnosis: Difficulty in walking, not elsewhere classified (R26.2);Muscle weakness (generalized) (M62.81);Unsteadiness on feet (R26.81)    Time: 1610-9604 PT Time Calculation (min) (ACUTE ONLY): 23 min   Charges:              Mickel Duhamel, SPT 11/15/2017, 11:03 AM

## 2017-11-15 NOTE — Care Management Note (Signed)
Case Management Note  Patient Details  Name: DAMEIAN CRISMAN MRN: [id number] Date of Birth: 02-08-1960  Subjective/Objective:           Spoke with Financial counseling Department Marlene Lard [phone].  Fleet Contras with Financial counseling will attempt to speak with pt today.          Action/Plan:   Expected Discharge Date:                  Expected Discharge Plan:  Home/Self Care  In-House Referral:     Discharge planning Services  CM Consult, Indigent Health Clinic, Medication Assistance, Financial Counselor   Post Acute Care Choice:    Choice offered to:     DME Arranged:    DME Agency:     HH Arranged:    HH Agency:     Status of Service:  In process, will continue to follow  If discussed at Long Length of Stay Meetings, dates discussed:    Additional Comments:  Allayne Butcher, RN  [phone] 11/15/2017, 9:26 AM

## 2017-11-16 DIAGNOSIS — I70299 Other atherosclerosis of native arteries of extremities, unspecified extremity: Secondary | ICD-10-CM

## 2017-11-16 DIAGNOSIS — L97909 Non-pressure chronic ulcer of unspecified part of unspecified lower leg with unspecified severity: Secondary | ICD-10-CM

## 2017-11-16 DIAGNOSIS — I10 Essential (primary) hypertension: Secondary | ICD-10-CM

## 2017-11-16 LAB — CBC
HEMATOCRIT: 26.2 % — AB (ref 39.0–52.0)
Hemoglobin: 9.3 g/dL — ABNORMAL LOW (ref 13.0–17.0)
MCH: 38.6 pg — AB (ref 26.0–34.0)
MCHC: 35.5 g/dL (ref 30.0–36.0)
MCV: 108.7 fL — AB (ref 80.0–100.0)
PLATELETS: 127 10*3/uL — AB (ref 150–400)
RBC: 2.41 MIL/uL — AB (ref 4.22–5.81)
RDW: 11.8 % (ref 11.5–15.5)
WBC: 6.3 10*3/uL (ref 4.0–10.5)
nRBC: 0 % (ref 0.0–0.2)

## 2017-11-16 LAB — BASIC METABOLIC PANEL
Anion gap: 8 (ref 5–15)
BUN: 8 mg/dL (ref 6–20)
CALCIUM: 7.9 mg/dL — AB (ref 8.9–10.3)
CO2: 26 mmol/L (ref 22–32)
Chloride: 102 mmol/L (ref 98–111)
Creatinine, Ser: 0.58 mg/dL — ABNORMAL LOW (ref 0.61–1.24)
GFR calc Af Amer: >60 mL/min (ref >60)
GFR calc non Af Amer: >60 mL/min (ref >60)
Glucose, Bld: 86 mg/dL (ref 70–99)
POTASSIUM: 3.3 mmol/L — AB (ref 3.5–5.1)
Sodium: 136 mmol/L (ref 135–145)

## 2017-11-16 LAB — MAGNESIUM: MAGNESIUM: 2 mg/dL (ref 1.7–2.4)

## 2017-11-16 MED ORDER — LABETALOL HCL 100 MG PO TABS
100.0000 mg | ORAL_TABLET | Freq: Two times a day (BID) | ORAL | Status: DC
  Administered 2017-11-16 – 2017-11-18 (×5): 100 mg via ORAL
  Filled 2017-11-16 (×6): qty 1

## 2017-11-16 MED ORDER — POTASSIUM CHLORIDE 20 MEQ PO PACK
40.0000 meq | PACK | Freq: Once | ORAL | Status: AC
  Administered 2017-11-16: 40 meq via ORAL
  Filled 2017-11-16: qty 2

## 2017-11-16 MED ORDER — CLOPIDOGREL BISULFATE 75 MG PO TABS
75.0000 mg | ORAL_TABLET | Freq: Every day | ORAL | Status: DC
  Administered 2017-11-17 – 2017-11-18 (×2): 75 mg via ORAL
  Filled 2017-11-16 (×2): qty 1

## 2017-11-16 MED ORDER — FUROSEMIDE 10 MG/ML IJ SOLN
20.0000 mg | Freq: Once | INTRAMUSCULAR | Status: AC
  Administered 2017-11-16: 20 mg via INTRAVENOUS
  Filled 2017-11-16: qty 2

## 2017-11-16 MED ORDER — ASPIRIN EC 81 MG PO TBEC
81.0000 mg | DELAYED_RELEASE_TABLET | Freq: Every day | ORAL | Status: DC
  Administered 2017-11-17 – 2017-11-18 (×2): 81 mg via ORAL
  Filled 2017-11-16 (×2): qty 1

## 2017-11-16 MED ORDER — FERROUS SULFATE 325 (65 FE) MG PO TABS
325.0000 mg | ORAL_TABLET | Freq: Every day | ORAL | Status: DC
  Administered 2017-11-17 – 2017-11-18 (×2): 325 mg via ORAL
  Filled 2017-11-16 (×2): qty 1

## 2017-11-16 NOTE — Progress Notes (Signed)
Patient sat of bed all am, walks well. Started on po meds this am, IV BP meds stopped. Bp currently 140 systolic, A line removed intact. Began on clear liquids this am , tolerated them well, advancing to full liquids for supper. To be transferred to floor, awaiting bed, [facility] x 2 to [facility] for BM and voiding,

## 2017-11-16 NOTE — Consult Note (Signed)
Reason for Consult: Blood pressure management Referring Physician: Dr. Lorenso Courier  History of present illness  Kyle Gordon is an 57 y.o. male.  With history of peripheral vascular disease status post aortobifemoral bypass for critical limb ischemia admitted by by vascular surgery, patient also has history of hypertension, alcohol abuse.  Hospitalist team is consulted for blood pressure management as blood pressure was high today morning.  Patient was started on esmolol gtt. and eventually it was discontinued as the patient did not respond to that.  Patient is started on labetalol 100 mg p.o. twice daily and during my examination his blood pressure is at 130/78 and resting comfortably.  Denies any headache blurry vision nausea or vomiting.  No other complaints.  Admits to drinking beer every day  Past Medical History:  Diagnosis Date  . Arthritis   . Hypertension    no meds  . Peripheral vascular disease ([place])    pad    Past Surgical History:  Procedure Laterality Date  . AORTA - BILATERAL FEMORAL ARTERY BYPASS GRAFT Bilateral 11/13/2017   Procedure: AORTA BIFEMORAL BYPASS GRAFT;  Surgeon: Algernon Huxley, MD;  Location: [facility] ORS;  Service: Vascular;  Laterality: Bilateral;  . INSERTION OF ILIAC STENT Right 11/13/2017   Procedure: INSERTION OF ILIAC STENT (SFA STENT PLACEMENT );  Surgeon: Algernon Huxley, MD;  Location: [facility] ORS;  Service: Vascular;  Laterality: Right;  . LOWER EXTREMITY ANGIOGRAPHY Right 10/14/2017   Procedure: LOWER EXTREMITY ANGIOGRAPHY;  Surgeon: Algernon Huxley, MD;  Location: [place] [facility];  Service: Cardiovascular;  Laterality: Right;  . MOUTH SURGERY      Family History  Problem Relation Age of Onset  . Heart attack Father     Social History:  reports that he has been smoking. He has never used smokeless tobacco. He reports that he drinks alcohol. He reports that he has current or past drug history. Drug: Marijuana.  Allergies: No Known  Allergies  Medications: I have reviewed the patient's current medications.  Results for orders placed or performed during the hospital encounter of 11/13/17 (from the past 48 hour(s))  Basic metabolic panel     Status: Abnormal   Collection Time: 11/15/17 10:25 AM  Result Value Ref Range   Sodium 137 135 - 145 mmol/L   Potassium 3.4 (L) 3.5 - 5.1 mmol/L   Chloride 104 98 - 111 mmol/L   CO2 25 22 - 32 mmol/L   Glucose, Bld 106 (H) 70 - 99 mg/dL   BUN <5 (L) 6 - 20 mg/dL   Creatinine, Ser 0.61 0.61 - 1.24 mg/dL   Calcium 8.2 (L) 8.9 - 10.3 mg/dL   GFR calc non Af Amer >60 >60 mL/min   GFR calc Af Amer >60 >60 mL/min    Comment: (NOTE) The eGFR has been calculated using the CKD EPI equation. This calculation has not been validated in all clinical situations. eGFR's persistently <60 mL/min signify possible Chronic Kidney Disease.    Anion gap 8 5 - 15    Comment: Performed at [facility], [address] [place], [place] [address]  Magnesium     Status: None   Collection Time: 11/15/17 10:25 AM  Result Value Ref Range   Magnesium 1.7 1.7 - 2.4 mg/dL    Comment: Performed at [facility], [address] [place], [place] [address]  CBC     Status: Abnormal   Collection Time: 11/15/17 10:25 AM  Result Value Ref Range   WBC  7.9 4.0 - 10.5 K/uL   RBC 2.86 (L) 4.22 - 5.81 MIL/uL   Hemoglobin 10.9 (L) 13.0 - 17.0 g/dL   HCT 31.6 (L) 39.0 - 52.0 %   MCV 110.5 (H) 80.0 - 100.0 fL   MCH 38.1 (H) 26.0 - 34.0 pg   MCHC 34.5 30.0 - 36.0 g/dL   RDW 12.2 11.5 - 15.5 %   Platelets 144 (L) 150 - 400 K/uL   nRBC 0.0 0.0 - 0.2 %    Comment: Performed at [facility], [address] [place], [place] [address]  CBC     Status: Abnormal   Collection Time: 11/16/17  6:25 AM  Result Value Ref Range   WBC 6.3 4.0 - 10.5 K/uL   RBC 2.41 (L) 4.22 - 5.81 MIL/uL   Hemoglobin 9.3 (L) 13.0 - 17.0 g/dL   HCT 26.2 (L) 39.0 - 52.0 %   MCV 108.7 (H) 80.0 - 100.0 fL    MCH 38.6 (H) 26.0 - 34.0 pg   MCHC 35.5 30.0 - 36.0 g/dL   RDW 11.8 11.5 - 15.5 %   Platelets 127 (L) 150 - 400 K/uL   nRBC 0.0 0.0 - 0.2 %    Comment: Performed at [facility], [address] [place], [place] [address]  Basic metabolic panel     Status: Abnormal   Collection Time: 11/16/17  6:25 AM  Result Value Ref Range   Sodium 136 135 - 145 mmol/L   Potassium 3.3 (L) 3.5 - 5.1 mmol/L   Chloride 102 98 - 111 mmol/L   CO2 26 22 - 32 mmol/L   Glucose, Bld 86 70 - 99 mg/dL   BUN 8 6 - 20 mg/dL   Creatinine, Ser 0.58 (L) 0.61 - 1.24 mg/dL   Calcium 7.9 (L) 8.9 - 10.3 mg/dL   GFR calc non Af Amer >60 >60 mL/min   GFR calc Af Amer >60 >60 mL/min    Comment: (NOTE) The eGFR has been calculated using the CKD EPI equation. This calculation has not been validated in all clinical situations. eGFR's persistently <60 mL/min signify possible Chronic Kidney Disease.    Anion gap 8 5 - 15    Comment: Performed at [facility], [address] [place], [place] [address]  Magnesium     Status: None   Collection Time: 11/16/17  6:25 AM  Result Value Ref Range   Magnesium 2.0 1.7 - 2.4 mg/dL    Comment: Performed at [facility], [address] [place], [place] [address]    No results found.  ROS:  CONSTITUTIONAL: Denies fevers, chills. Denies any fatigue, weakness.  EYES: Denies blurry vision, double vision, eye pain. EARS, NOSE, THROAT: Denies tinnitus, ear pain, hearing loss. RESPIRATORY: Denies cough, wheeze, shortness of breath.  CARDIOVASCULAR: Denies chest pain, palpitations, edema.  GASTROINTESTINAL: Denies nausea, vomiting, diarrhea, abdominal pain. Denies bright red blood per rectum. GENITOURINARY: Denies dysuria, hematuria. ENDOCRINE: Denies nocturia or thyroid problems. HEMATOLOGIC AND LYMPHATIC: Denies easy bruising or bleeding. SKIN: Denies rash or lesion. MUSCULOSKELETAL: Denies pain in neck, back, shoulder, knees, hips or arthritic  symptoms.  NEUROLOGIC: Denies paralysis, paresthesias.  PSYCHIATRIC: Denies anxiety or depressive symptoms. Blood pressure 129/70, pulse 83, temperature 98.4 F (36.9 C), resp. rate 16, height 5' 7" (1.702 m), weight 56.5 kg, SpO2 98 %.   PHYSICAL EXAMINATION:  GENERAL: Well-nourished, well-developed , currently in no acute distress.  HEAD: Normocephalic, atraumatic.  EYES: Pupils equal, round, and reactive to light.  Extraocular muscles intact. No scleral icterus.  MOUTH: Moist mucosal membranes. Dentition intact. No abscess noted. EARS, NOSE, THROAT: Clear without exudates. No external lesions.  NECK: Supple. No thyromegaly. No nodules. No JVD.  PULMONARY: Clear to auscultation bilaterally without wheezes, rales, or rhonchi. No use of accessory muscles. Good respiratory effort. CHEST: Nontender to palpation.  CARDIOVASCULAR: S1, S2, regular rate and rhythm. No murmurs, rubs, or gallops.  GASTROINTESTINAL: Soft, abdominal honeycomb dressing extending into the bilateral femoral area is intact  nondistended.  MUSCULOSKELETAL: No swelling, clubbing, edema. Range of motion full in all extremities. NEUROLOGIC: Cranial nerves II-XII intact. No gross focal neurological deficits. Sensation intact. Reflexes intact. SKIN: No ulcerations, lesions, rash, cyanosis. Skin warm, dry. Turgor intact. PSYCHIATRIC: Mood, affect within normal limits. Patient awake, alert, oriented x 3. Insight and judgment intact.   Assessment/Plan:  Essential hypertension Blood pressure is stable at this time.   Continue labetalol and titrate as needed Hydralazine as needed Patient's pain needs to be well controlled, which has been accomplished with Toradol continue the same  Acute blood loss anemia-from surgery Hemoglobin 12.4-10.9-9.3 Continue close monitoring and start iron supplements with stool softeners Repeat a.m. labs  Hypokalemia Repleted and recheck in a.m. Magnesium at 2.0  Alcohol abuse Counseled  patient to stop drinking alcohol and outpatient follow-up with alcohol Anonymous, patient is agreeable  POD #3 s/p aortobifemoral bypass and bilateral femoral endarterectomies for critical limb ischemia Patient is on liquid diet at this time ,management by attending physician vascular surgery   Thank you for allowing hospitalist team to take care of this nice patient TOTAL TIME TAKING CARE OF THIS PATIENT:  40  minutes.   Note: This dictation was prepared with Dragon dictation along with smaller phrase technology. Any transcriptional errors that result from this process are unintentional.   _0 @ Pager - [phone] 11/16/2017, 2:55 PM

## 2017-11-16 NOTE — Progress Notes (Signed)
Telemetry called and notified of a three beat of trigeminy, MD notified and will transfer to floor on tele

## 2017-11-16 NOTE — Progress Notes (Signed)
Report called to floor, Nedra Hai RN  transferred in stable condition

## 2017-11-16 NOTE — Consult Note (Signed)
Reason for Consult: Assistance with management of hypertension Referring Physician: Alta Corning MD  Kyle Gordon is an 57 y.o. male.  HPI:   Patient is a 57 year old current smoker, who presented with critical limb ischemia and underwent bilateral aorta bifem bypass and right iliac stent three days ago. Postoperatively he has had relatively uneventful course with the exception of notable hypertension and was placed on an as small drip by vascular surgery. He has remained on a spinal drip for control of his hypertension. He had one repleted he has not had any further episodes since then. Patient does not have a regular physician and was not aware of having issues with hypertension. He does not endorse any chest pain, lower extremity edema, orthopnea or paroxysmal nocturnal dyspnea. He has no respiratory complaints. 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 He does not take any medications as an outpatient.    Past Medical History:  Diagnosis Date  . Arthritis   . Hypertension    no meds  . Peripheral vascular disease ([place])    pad    Past Surgical History:  Procedure Laterality Date  . AORTA - BILATERAL FEMORAL ARTERY BYPASS GRAFT Bilateral 11/13/2017   Procedure: AORTA BIFEMORAL BYPASS GRAFT;  Surgeon: Algernon Huxley, MD;  Location: [facility];  Service: Vascular;  Laterality: Bilateral;  . INSERTION OF ILIAC STENT Right 11/13/2017   Procedure: INSERTION OF ILIAC STENT (SFA STENT PLACEMENT );  Surgeon: Algernon Huxley, MD;  Location: [facility];  Service: Vascular;  Laterality: Right;  . LOWER EXTREMITY ANGIOGRAPHY Right 10/14/2017   Procedure: LOWER EXTREMITY ANGIOGRAPHY;  Surgeon: Algernon Huxley, MD;  Location: [place] [facility];  Service: Cardiovascular;  Laterality: Right;  . MOUTH SURGERY      Family History  Problem Relation Age of Onset  . Heart attack Father     Social History:  reports that he has been smoking. He has  never used smokeless tobacco. He reports that he drinks alcohol. He reports that he has current or past drug history. Drug: Marijuana. 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  Allergies: No Known Allergies  Medications: I have reviewed the patient's current medications.  Results for orders placed or performed during the hospital encounter of 11/13/17 (from the past 48 hour(s))  Basic metabolic panel     Status: Abnormal   Collection Time: 11/15/17 10:25 AM  Result Value Ref Range   Sodium 137 135 - 145 mmol/L   Potassium 3.4 (L) 3.5 - 5.1 mmol/L   Chloride 104 98 - 111 mmol/L   CO2 25 22 - 32 mmol/L   Glucose, Bld 106 (H) 70 - 99 mg/dL   BUN <5 (L) 6 - 20 mg/dL   Creatinine, Ser 0.61 0.61 - 1.24 mg/dL   Calcium 8.2 (L) 8.9 - 10.3 mg/dL   GFR calc non Af Amer >60 >60 mL/min   GFR calc Af Amer >60 >60 mL/min    Comment: (NOTE) The eGFR has been calculated using the CKD EPI equation. This calculation has not been validated in all clinical situations. eGFR's persistently <60 mL/min signify possible Chronic Kidney Disease.    Anion gap 8 5 - 15    Comment: Performed at [facility], [address] [place], [place] [address]  Magnesium     Status: None   Collection Time: 11/15/17 10:25 AM  Result Value  Ref Range   Magnesium 1.7 1.7 - 2.4 mg/dL    Comment: Performed at [facility], [address] [place], [place] [address]  CBC     Status: Abnormal   Collection Time: 11/15/17 10:25 AM  Result Value Ref Range   WBC 7.9 4.0 - 10.5 K/uL   RBC 2.86 (L) 4.22 - 5.81 MIL/uL   Hemoglobin 10.9 (L) 13.0 - 17.0 g/dL   HCT 31.6 (L) 39.0 - 52.0 %   MCV 110.5 (H) 80.0 - 100.0 fL   MCH 38.1 (H) 26.0 - 34.0 pg   MCHC 34.5 30.0 - 36.0 g/dL   RDW 12.2 11.5 - 15.5 %   Platelets 144 (L) 150 - 400 K/uL   nRBC 0.0 0.0 - 0.2 %    Comment: Performed at [facility], [address] [place], [place] [address]   CBC     Status: Abnormal   Collection Time: 11/16/17  6:25 AM  Result Value Ref Range   WBC 6.3 4.0 - 10.5 K/uL   RBC 2.41 (L) 4.22 - 5.81 MIL/uL   Hemoglobin 9.3 (L) 13.0 - 17.0 g/dL   HCT 26.2 (L) 39.0 - 52.0 %   MCV 108.7 (H) 80.0 - 100.0 fL   MCH 38.6 (H) 26.0 - 34.0 pg   MCHC 35.5 30.0 - 36.0 g/dL   RDW 11.8 11.5 - 15.5 %   Platelets 127 (L) 150 - 400 K/uL   nRBC 0.0 0.0 - 0.2 %    Comment: Performed at [facility], [address] [place], [place] [address]  Basic metabolic panel     Status: Abnormal   Collection Time: 11/16/17  6:25 AM  Result Value Ref Range   Sodium 136 135 - 145 mmol/L   Potassium 3.3 (L) 3.5 - 5.1 mmol/L   Chloride 102 98 - 111 mmol/L   CO2 26 22 - 32 mmol/L   Glucose, Bld 86 70 - 99 mg/dL   BUN 8 6 - 20 mg/dL   Creatinine, Ser 0.58 (L) 0.61 - 1.24 mg/dL   Calcium 7.9 (L) 8.9 - 10.3 mg/dL   GFR calc non Af Amer >60 >60 mL/min   GFR calc Af Amer >60 >60 mL/min    Comment: (NOTE) The eGFR has been calculated using the CKD EPI equation. This calculation has not been validated in all clinical situations. eGFR's persistently <60 mL/min signify possible Chronic Kidney Disease.    Anion gap 8 5 - 15    Comment: Performed at [facility], [address] [place],  [address]  Magnesium     Status: None   Collection Time: 11/16/17  6:25 AM  Result Value Ref Range   Magnesium 2.0 1.7 - 2.4 mg/dL    Comment: Performed at [facility], [address] [place],  [address]    No results found.  Review of Systems  Constitutional: Negative.   HENT: Negative.   Eyes: Negative.   Respiratory: Negative.   Cardiovascular: Positive for claudication.  Gastrointestinal: Negative.   Genitourinary: Negative.   Musculoskeletal: Negative.   Skin: Negative.   Neurological: Negative.   Endo/Heme/Allergies: Negative.   Psychiatric/Behavioral: Negative.      Blood pressure 130/78, pulse 81, temperature 98.4 F (36.9  C), resp. rate 17, height '5\' 7"'  (1.702 m), weight 56.5 kg, SpO2 97 %. Physical Exam  Nursing note and vitals reviewed. Constitutional: He is oriented to person, place, and time. He appears well-developed and well-nourished. No distress.  HENT:  Head: Normocephalic and atraumatic.  Right Ear: External ear normal.  Left Ear: External ear normal.  Mouth/Throat: Oropharynx is clear and moist.  Large lipoma on left cheek. Poor dentition.  Eyes: Pupils are equal, round, and reactive to light. Conjunctivae are normal. No scleral icterus.  Neck: Neck supple. No JVD present. No tracheal deviation present. No thyromegaly present.  Cardiovascular: Normal rate, regular rhythm, normal heart sounds and intact distal pulses.  Distal pulses intact after surgery.  Respiratory: Effort normal and breath sounds normal.  GI: Soft. Bowel sounds are normal. He exhibits no distension.  Musculoskeletal: Normal range of motion. He exhibits no edema.  Lymphadenopathy:    He has no cervical adenopathy.  Neurological: He is alert and oriented to person, place, and time.  No focal deficits noted.  Skin: Skin is warm and dry. He is not diaphoretic.  Chronic skin or changes of the lower extremities.  Psychiatric: He has a normal mood and affect. His behavior is normal.    Assessment/Plan:  1) Hypertension, this appears to be essential hypertension, the patient has not received adequate outpatient care of this issue. We will transition as small to labetalol twice a day 100 mg. Recommend that consultation with the hospitalist service be performed as it appears that he will be able to transition off of the esmolol drip and transfer out of the unit. He does not have ongoing pulmonary or critical care issues that would require ongoing follow-up by pulmonary/critical care outside of the unit.  2) Volume overload, the patient received Lasix per your surgery.  3) Trigeminy, one episode, this occurred prior to potassium  supplementation. The patient was noted to have hypokalemia agree with potassium repletion.[place] to participate in this patient's care  4) Tobacco dependence due to cigarettes, the patient was counseled regards to discontinuation of smoking. Total counseling time three minutes.   Thank you for allowing [facility] to participate in this patient's care. More standpoint the patient may transfer out of the unit. Recommend at least 24 hours of telemetry given his episode of trigeminy earlier today.   Vernard Gambles 11/16/2017, 4:12 PM

## 2017-11-16 NOTE — Progress Notes (Signed)
Subjective: Interval History: POD #3 s/p aortobifemoral bypass for critical limb ischemia.  Patient doing well.  Pain controlled.  NG removed yesterday and tolerating sips/chips.  No n/v.  Passing flatus.  BP remains elevated on esmolol gtt.    Objective: Vital signs in last 24 hours: Temp:  [98.3 F (36.8 C)-98.9 F (37.2 C)] 98.4 F (36.9 C) (10/19 0800) Pulse Rate:  [77-98] 81 (10/19 0800) Resp:  [15-21] 20 (10/19 0800) BP: (123-155)/(66-121) 123/75 (10/19 0800) SpO2:  [94 %-99 %] 99 % (10/19 0800) Arterial Line BP: (129-184)/(53-91) 157/68 (10/19 0800)  Intake/Output from previous day: 10/18 0701 - 10/19 0700 In: 3051.8 [I.V.:2315.1; IV Piggyback:736.7] Out: 2175 [Urine:1875; Emesis/NG output:300] Intake/Output this shift: No intake/output data recorded.  General appearance: alert and no distress Resp: clear to auscultation bilaterally Cardio: regular rate and rhythm, S1, S2 normal, no murmur, click, rub or gallop GI: soft, non-tender; bowel sounds normal; no masses,  no organomegaly Extremities: feet warm with doppler signals Incision/Wound: abdominal dressing in place, c/d/i  Lab Results: Recent Labs    11/15/17 1025 11/16/17 0625  WBC 7.9 6.3  HGB 10.9* 9.3*  HCT 31.6* 26.2*  PLT 144* 127*   BMET Recent Labs    11/15/17 1025 11/16/17 0625  NA 137 136  K 3.4* 3.3*  CL 104 102  CO2 25 26  GLUCOSE 106* 86  BUN <5* 8  CREATININE 0.61 0.58*  CALCIUM 8.2* 7.9*    Studies/Results: Nm Myocar Multi W/spect W/wall Motion / Ef  Result Date: 11/12/2017  The study is normal.  This is a low risk study.  The left ventricular ejection fraction is normal (55-65%).  There was no ST segment deviation noted during stress.  Negative Lexiscan stress LV function normal No significant reversible ischemia Low risk study   Dg Chest Port 1 View  Result Date: 11/13/2017 CLINICAL DATA:  Status post central line placement. EXAM: PORTABLE CHEST 1 VIEW COMPARISON:  None.  FINDINGS: The heart size and mediastinal contours are within normal limits. Both lungs are clear. No pneumothorax or pleural effusion is noted. Distal tip of nasogastric tube is seen in proximal stomach. Left internal jugular catheter is noted with distal tip in expected position of the SVC. The visualized skeletal structures are unremarkable. IMPRESSION: Nasogastric tube tip seen in proximal stomach. Left internal jugular catheter is noted with distal tip in expected position of the SVC. No acute cardiopulmonary abnormality seen. Electronically Signed   By: Lupita Raider, M.D.   On: 11/13/2017 15:01   Dg C-arm 1-60 Min-no Report  Result Date: 11/13/2017 Fluoroscopy was utilized by the requesting physician.  No radiographic interpretation.   Anti-infectives: Anti-infectives (From admission, onward)   Start     Dose/Rate Route Frequency Ordered Stop   11/13/17 2000  ceFAZolin (ANCEF) IVPB 2g/100 mL premix     2 g 200 mL/hr over 30 Minutes Intravenous Every 8 hours 11/13/17 1533 11/14/17 0355   11/13/17 0600  ceFAZolin (ANCEF) IVPB 2g/100 mL premix     2 g 200 mL/hr over 30 Minutes Intravenous On call to O.R. 11/12/17 2223 11/13/17 1151   11/13/17 0555  ceFAZolin (ANCEF) 2-4 GM/100ML-% IVPB    Note to Pharmacy:  [place] M[facility]: cabinet override      11/13/17 0555 11/13/17 0751      Assessment/Plan: s/p Procedure(s): AORTA BIFEMORAL BYPASS GRAFT (Bilateral) INSERTION OF ILIAC STENT (SFA STENT PLACEMENT ) (Right)   -Advance diet as tolerated -Consult intensivist for assistance with BP management -Hospitalist  referral placed for continued medical consultation once patient transfers out of ICU -Gentle diuresis -Restart ASA/Plavix tomorrow if tolerating diet   LOS: 3 days   Rosana Berger 11/16/2017, 10:27 AM

## 2017-11-16 NOTE — Progress Notes (Signed)
Family Meeting Note  Advance Directive:yes  Today a meeting took place with the Patient.     The following clinical team members were present during this meeting:MD  The following were discussed:Patient's diagnosis: Essential hypertension, alcohol abuse, hypokalemia, acute blood loss anemia peripheral arterial disease status post aortobifemoral bypass, treatment plan of care discussed in detail with the patient.  He verbalized understanding of the plan.   patient's progosis: > 12 months and Goals for treatment: Full Code , mom is HCPOA  Additional follow-up to be provided: Vascular surgery, hospitalist  Time spent during discussion - 17 min Ramonita Lab, MD

## 2017-11-17 LAB — BASIC METABOLIC PANEL
ANION GAP: 7 (ref 5–15)
BUN: 7 mg/dL (ref 6–20)
CALCIUM: 7.8 mg/dL — AB (ref 8.9–10.3)
CO2: 26 mmol/L (ref 22–32)
Chloride: 102 mmol/L (ref 98–111)
Creatinine, Ser: 0.61 mg/dL (ref 0.61–1.24)
Glucose, Bld: 102 mg/dL — ABNORMAL HIGH (ref 70–99)
POTASSIUM: 3.1 mmol/L — AB (ref 3.5–5.1)
Sodium: 135 mmol/L (ref 135–145)

## 2017-11-17 LAB — CBC
HEMATOCRIT: 25.9 % — AB (ref 39.0–52.0)
Hemoglobin: 9.1 g/dL — ABNORMAL LOW (ref 13.0–17.0)
MCH: 37.3 pg — ABNORMAL HIGH (ref 26.0–34.0)
MCHC: 35.1 g/dL (ref 30.0–36.0)
MCV: 106.1 fL — ABNORMAL HIGH (ref 80.0–100.0)
NRBC: 0 % (ref 0.0–0.2)
PLATELETS: 153 10*3/uL (ref 150–400)
RBC: 2.44 MIL/uL — AB (ref 4.22–5.81)
RDW: 11.5 % (ref 11.5–15.5)
WBC: 4.8 10*3/uL (ref 4.0–10.5)

## 2017-11-17 LAB — MAGNESIUM: Magnesium: 1.8 mg/dL (ref 1.7–2.4)

## 2017-11-17 MED ORDER — POTASSIUM CHLORIDE CRYS ER 20 MEQ PO TBCR
20.0000 meq | EXTENDED_RELEASE_TABLET | Freq: Two times a day (BID) | ORAL | Status: DC
  Administered 2017-11-17 – 2017-11-18 (×3): 20 meq via ORAL
  Filled 2017-11-17 (×3): qty 1

## 2017-11-17 MED ORDER — AMLODIPINE BESYLATE 5 MG PO TABS
5.0000 mg | ORAL_TABLET | Freq: Every day | ORAL | Status: DC
  Administered 2017-11-17 – 2017-11-18 (×2): 5 mg via ORAL
  Filled 2017-11-17 (×2): qty 1

## 2017-11-17 NOTE — Progress Notes (Signed)
Subjective: Interval History: POD #4 s/p aortobifemoral bypass for critical limb ischemia.  Patient doing well.  Pain controlled.  Tolerating liquids.  No n/v.  Passing flatus.  BP controlled on PO labetalol.  Ambulating well.  Objective: Vital signs in last 24 hours: Temp:  [97.7 F (36.5 C)-98.4 F (36.9 C)] 97.9 F (36.6 C) (10/20 0456) Pulse Rate:  [29-89] 69 (10/20 0814) Resp:  [16-20] 20 (10/20 0456) BP: (129-164)/(70-95) 141/78 (10/20 0814) SpO2:  [97 %-100 %] 97 % (10/20 0456) Arterial Line BP: (144-171)/(72-88) 144/72 (10/19 1300)  Intake/Output from previous day: 10/19 0701 - 10/20 0700 In: 1635.5 [P.O.:840; I.V.:671.9; IV Piggyback:123.6] Out: -  Intake/Output this shift: No intake/output data recorded.  General appearance: alert and no distress Resp: clear to auscultation bilaterally Cardio: regular rate and rhythm, S1, S2 normal, no murmur, click, rub or gallop GI: soft, non-tender; bowel sounds normal; no masses,  no organomegaly Extremities: feet warm with doppler signals Incision/Wound: abdominal dressing in place, c/d/i  Lab Results: Recent Labs    11/16/17 0625 11/17/17 0507  WBC 6.3 4.8  HGB 9.3* 9.1*  HCT 26.2* 25.9*  PLT 127* 153   BMET Recent Labs    11/16/17 0625 11/17/17 0507  NA 136 135  K 3.3* 3.1*  CL 102 102  CO2 26 26  GLUCOSE 86 102*  BUN 8 7  CREATININE 0.58* 0.61  CALCIUM 7.9* 7.8*    Studies/Results: Nm Myocar Multi W/spect W/wall Motion / Ef  Result Date: 11/12/2017  The study is normal.  This is a low risk study.  The left ventricular ejection fraction is normal (55-65%).  There was no ST segment deviation noted during stress.  Negative Lexiscan stress LV function normal No significant reversible ischemia Low risk study   Dg Chest Port 1 View  Result Date: 11/13/2017 CLINICAL DATA:  Status post central line placement. EXAM: PORTABLE CHEST 1 VIEW COMPARISON:  None. FINDINGS: The heart size and mediastinal contours  are within normal limits. Both lungs are clear. No pneumothorax or pleural effusion is noted. Distal tip of nasogastric tube is seen in proximal stomach. Left internal jugular catheter is noted with distal tip in expected position of the SVC. The visualized skeletal structures are unremarkable. IMPRESSION: Nasogastric tube tip seen in proximal stomach. Left internal jugular catheter is noted with distal tip in expected position of the SVC. No acute cardiopulmonary abnormality seen. Electronically Signed   By: Lupita Raider, M.D.   On: 11/13/2017 15:01   Dg C-arm 1-60 Min-no Report  Result Date: 11/13/2017 Fluoroscopy was utilized by the requesting physician.  No radiographic interpretation.   Anti-infectives: Anti-infectives (From admission, onward)   Start     Dose/Rate Route Frequency Ordered Stop   11/13/17 2000  ceFAZolin (ANCEF) IVPB 2g/100 mL premix     2 g 200 mL/hr over 30 Minutes Intravenous Every 8 hours 11/13/17 1533 11/14/17 0355   11/13/17 0600  ceFAZolin (ANCEF) IVPB 2g/100 mL premix     2 g 200 mL/hr over 30 Minutes Intravenous On call to O.R. 11/12/17 2223 11/13/17 1151   11/13/17 0555  ceFAZolin (ANCEF) 2-4 GM/100ML-% IVPB    Note to Pharmacy:  F[place]on Mole: cabinet override      11/13/17 0555 11/13/17 0751      Assessment/Plan: s/p Procedure(s): AORTA BIFEMORAL BYPASS GRAFT (Bilateral) INSERTION OF ILIAC STENT (SFA STENT PLACEMENT ) (Right)   -Advance diet as tolerated -Appreciate hospitalist management of medical comorbidities -Restart ASA/Plavix  -Dispo:  Tentative discharge  tomorrow if continues to do well   LOS: 4 days   Rosana Berger 11/17/2017, 11:20 AM

## 2017-11-17 NOTE — Progress Notes (Signed)
[facility] [place] [facility]      PATIENT NAME: Kyle Gordon    MR#:  [id number]  DATE OF BIRTH:  1960-10-01  SUBJECTIVE:   Patient is status post aortobifemoral bypass for critical limb ischemia postop day #4 today.  Denies any pain.  Blood pressure is better controlled today.  No other acute events overnight.  REVIEW OF SYSTEMS:    Review of Systems  Constitutional: Negative for chills and fever.  HENT: Negative for congestion and tinnitus.   Eyes: Negative for blurred vision and double vision.  Respiratory: Negative for cough, shortness of breath and wheezing.   Cardiovascular: Negative for chest pain, orthopnea and PND.  Gastrointestinal: Negative for abdominal pain, diarrhea, nausea and vomiting.  Genitourinary: Negative for dysuria and hematuria.  Neurological: Negative for dizziness, sensory change and focal weakness.  All other systems reviewed and are negative.   Nutrition: Regular Tolerating Diet: yes Tolerating PT:  Ambulatory  DRUG ALLERGIES:  No Known Allergies  VITALS:  Blood pressure (!) 141/78, pulse 69, temperature 97.9 F (36.6 C), temperature source Oral, resp. rate 20, height 5\' 7"  (1.702 m), weight 56.5 kg, SpO2 97 %.  PHYSICAL EXAMINATION:   Physical Exam  GENERAL:  57 y.o.-year-old patient lying in bed in no acute distress.  EYES: Pupils equal, round, reactive to light and accommodation. No scleral icterus. Extraocular muscles intact.  HEENT: Head atraumatic, normocephalic. Oropharynx and nasopharynx clear.  NECK:  Supple, no jugular venous distention. No thyroid enlargement, no tenderness.  LUNGS: Normal breath sounds bilaterally, no wheezing, rales, rhonchi. No use of accessory muscles of respiration.  CARDIOVASCULAR: S1, S2 normal. No murmurs, rubs, or gallops.  ABDOMEN: Soft, nontender, nondistended. Bowel sounds present. No organomegaly or mass.  EXTREMITIES: No cyanosis, clubbing or edema b/l.    NEUROLOGIC:  Cranial nerves II through XII are intact. No focal Motor or sensory deficits b/l.   PSYCHIATRIC: The patient is alert and oriented x 3.  SKIN: No obvious rash, lesion, or ulcer.    LABORATORY PANEL:   CBC Recent Labs  Lab 11/17/17 0507  WBC 4.8  HGB 9.1*  HCT 25.9*  PLT 153   ------------------------------------------------------------------------------------------------------------------  Chemistries  Recent Labs  Lab 11/17/17 0507  NA 135  K 3.1*  CL 102  CO2 26  GLUCOSE 102*  BUN 7  CREATININE 0.61  CALCIUM 7.8*  MG 1.8   ------------------------------------------------------------------------------------------------------------------  Cardiac Enzymes No results for input(s): TROPONINI in the last 168 hours. ------------------------------------------------------------------------------------------------------------------  RADIOLOGY:  No results found.   ASSESSMENT AND PLAN:   57 year old male with past medical history of essential hypertension, peripheral vascular disease, osteoarthritis who had a aortobifemoral bypass secondary to critical limb ischemia and postoperatively noted to be significantly hypertensive.  1.  Accelerated hypertension- patient was not on any antihypertensives prior to coming to the hospital. -Much improved today.  Continue labetalol, will add some Norvasc. -Continue as needed IV hydralazine.  2.  Status post aortobifemoral bypass postop day #4-continue pain control and further care as per vascular surgery. -Continue aspirin, Plavix.  3. Anemia of chronic Disease - cont. Ferrous sulfate.   4. GERD - cont. Pepcid.    All the records are reviewed and case discussed with Care Management/Social Worker. Management plans discussed with the patient, family and they are in agreement.  CODE STATUS: Full code  DVT Prophylaxis: Lovenox  TOTAL TIME TAKING CARE OF THIS PATIENT: 30 minutes.   POSSIBLE D/C IN 1-2 DAYS, DEPENDING ON  CLINICAL CONDITION.  Houston Siren M.D on 11/17/2017 at 1:32 PM  Between 7am to 6pm - Pager - [phone]  After 6pm go to [email or web address] - Social research officer, government  [facility] [place] [facility]  Office  [phone]  CC: Primary care physician; Patient, No Pcp Per

## 2017-11-18 LAB — CBC
HEMATOCRIT: 28.3 % — AB (ref 39.0–52.0)
Hemoglobin: 9.9 g/dL — ABNORMAL LOW (ref 13.0–17.0)
MCH: 37.2 pg — AB (ref 26.0–34.0)
MCHC: 35 g/dL (ref 30.0–36.0)
MCV: 106.4 fL — AB (ref 80.0–100.0)
Platelets: 211 10*3/uL (ref 150–400)
RBC: 2.66 MIL/uL — ABNORMAL LOW (ref 4.22–5.81)
RDW: 11.9 % (ref 11.5–15.5)
WBC: 4.6 10*3/uL (ref 4.0–10.5)
nRBC: 0 % (ref 0.0–0.2)

## 2017-11-18 LAB — BASIC METABOLIC PANEL
ANION GAP: 9 (ref 5–15)
BUN: 5 mg/dL — ABNORMAL LOW (ref 6–20)
CALCIUM: 8.2 mg/dL — AB (ref 8.9–10.3)
CO2: 24 mmol/L (ref 22–32)
Chloride: 105 mmol/L (ref 98–111)
Creatinine, Ser: 0.63 mg/dL (ref 0.61–1.24)
GFR calc Af Amer: >60 mL/min (ref >60)
GFR calc non Af Amer: >60 mL/min (ref >60)
GLUCOSE: 100 mg/dL — AB (ref 70–99)
Potassium: 3.1 mmol/L — ABNORMAL LOW (ref 3.5–5.1)
Sodium: 138 mmol/L (ref 135–145)

## 2017-11-18 LAB — MAGNESIUM: Magnesium: 1.8 mg/dL (ref 1.7–2.4)

## 2017-11-18 MED ORDER — ATORVASTATIN CALCIUM 10 MG PO TABS: 10.0000 mg | ORAL_TABLET | Freq: Every day | ORAL | 11 refills | Status: DC

## 2017-11-18 MED ORDER — AMLODIPINE BESYLATE 5 MG PO TABS: 5.0000 mg | ORAL_TABLET | Freq: Every day | ORAL | 3 refills | Status: DC

## 2017-11-18 MED ORDER — CLOPIDOGREL BISULFATE 75 MG PO TABS: 75.0000 mg | ORAL_TABLET | Freq: Every day | ORAL | 5 refills | Status: DC

## 2017-11-18 MED ORDER — LABETALOL HCL 100 MG PO TABS: 100.0000 mg | ORAL_TABLET | Freq: Two times a day (BID) | ORAL | 3 refills | Status: DC

## 2017-11-18 MED ORDER — ASPIRIN 81 MG PO TBEC: 81.0000 mg | DELAYED_RELEASE_TABLET | Freq: Every day | ORAL | 3 refills | Status: DC

## 2017-11-18 MED ORDER — DOCUSATE SODIUM 100 MG PO CAPS: 100.0000 mg | ORAL_CAPSULE | Freq: Every day | ORAL | 0 refills | Status: DC

## 2017-11-18 MED ORDER — OXYCODONE-ACETAMINOPHEN 5-325 MG PO TABS: 1.0000 | ORAL_TABLET | ORAL | 0 refills | Status: DC | PRN

## 2017-11-18 NOTE — Discharge Summary (Signed)
[facility]    Discharge Summary    Patient ID:  Kyle Gordon MRN: [id number] DOB/AGE: 05-23-1960 57 y.o.  Admit date: 11/13/2017 Discharge date: 11/18/2017 Date of Surgery: 11/13/2017 Surgeon: Surgeon(s): Schnier, Latina Craver, MD Wyn Quaker Marlow Baars, MD  Admission Diagnosis: ASO WITH ULCERATION  Discharge Diagnoses:  ASO WITH ULCERATION  Secondary Diagnoses: Past Medical History:  Diagnosis Date  . Arthritis   . Hypertension    no meds  . Peripheral vascular disease (HCC)    pad    Procedure(s): AORTA BIFEMORAL BYPASS GRAFT INSERTION OF ILIAC STENT (SFA STENT PLACEMENT )  Discharged Condition: good  HPI:  Patient with severe PAD and rest pain in his feet and non-healing ulcerations on the right foot.  Here for revascularization  Hospital Course:  Kyle Gordon is a 57 y.o. male is S/P Bilateral Procedure(s): AORTA BIFEMORAL BYPASS GRAFT, BILATERAL FEMORAL ENDARTERECTOMIES Extubated: POD # 0 Physical exam: feet warm, good pedal signals Post-op wounds clean, dry, intact or healing well Pt. Ambulating, voiding and taking PO diet without difficulty. Pt pain controlled with PO pain meds. Labs as below Complications:none  Consults:    Significant Diagnostic Studies: CBC Lab Results  Component Value Date   WBC 4.6 11/18/2017   HGB 9.9 (L) 11/18/2017   HCT 28.3 (L) 11/18/2017   MCV 106.4 (H) 11/18/2017   PLT 211 11/18/2017    BMET    Component Value Date/Time   NA 138 11/18/2017 0712   K 3.1 (L) 11/18/2017 0712   CL 105 11/18/2017 0712   CO2 24 11/18/2017 0712   GLUCOSE 100 (H) 11/18/2017 0712   BUN 5 (L) 11/18/2017 0712   CREATININE 0.63 11/18/2017 0712   CALCIUM 8.2 (L) 11/18/2017 0712   GFRNONAA >60 11/18/2017 0712   GFRAA >60 11/18/2017 7829   COAG Lab Results  Component Value Date   INR 1.02 11/13/2017   INR 0.83 11/06/2017     Disposition:  Discharge to :Home Discharge Instructions    Call MD for:   redness, tenderness, or signs of infection (pain, swelling, bleeding, redness, odor or green/yellow discharge around incision site)   Complete by:  As directed    Call MD for:  severe or increased pain, loss or decreased feeling  in affected limb(s)   Complete by:  As directed    Call MD for:  temperature >100.5   Complete by:  As directed    Driving Restrictions   Complete by:  As directed    No driving for one week   Lifting restrictions   Complete by:  As directed    No lifting for 2 weeks   No dressing needed   Complete by:  As directed    Replace only if drainage present   Resume previous diet   Complete by:  As directed      Allergies as of 11/18/2017   No Known Allergies     Medication List    TAKE these medications   ADVIL PO Take by mouth as needed.   amLODipine 5 MG tablet Commonly known as:  NORVASC Take 1 tablet (5 mg total) by mouth daily. Start taking on:  11/19/2017   aspirin 81 MG EC tablet Take 1 tablet (81 mg total) by mouth daily. Start taking on:  11/19/2017   atorvastatin 10 MG tablet Commonly known as:  LIPITOR Take 1 tablet (10 mg total) by mouth daily.   celecoxib 100 MG capsule Commonly known as:  CELEBREX  Take 100 mg by mouth 2 (two) times daily. As needed   clopidogrel 75 MG tablet Commonly known as:  PLAVIX Take 1 tablet (75 mg total) by mouth daily. Start taking on:  11/19/2017   docusate sodium 100 MG capsule Commonly known as:  COLACE Take 1 capsule (100 mg total) by mouth daily. Start taking on:  11/19/2017   labetalol 100 MG tablet Commonly known as:  NORMODYNE Take 1 tablet (100 mg total) by mouth 2 (two) times daily.   oxyCODONE-acetaminophen 5-325 MG tablet Commonly known as:  PERCOCET/ROXICET Take 1-2 tablets by mouth every 4 (four) hours as needed for moderate pain.   traMADol 50 MG tablet Commonly known as:  ULTRAM Take 1 tablet (50 mg total) by mouth every 6 (six) hours as needed for severe pain.      Verbal  and written Discharge instructions given to the patient. Wound care per Discharge AVS Follow-up Information    Georgiana Spinner, NP Follow up in 10 day(s).   Specialty:  Nurse Practitioner Why:  for wound check, staple removal Contact information: [address] [place] [address] [phone]           Signed: Festus Barren, MD  11/18/2017, 12:02 PM

## 2017-11-18 NOTE — Progress Notes (Signed)
Discharge instructions reviewed with patient including followup visits and new medications.  Understanding was verbalized and all questions were answered.  Central line removed without complication; patient tolerated well.  Patient discharged home via wheelchair in stable condition escorted by volunteer staff.

## 2017-11-18 NOTE — Progress Notes (Signed)
Physical Therapy Treatment Patient Details Name: Kyle Gordon MRN: [id number] DOB: 09/16/1960 Today's Date: 11/18/2017    History of Present Illness Pt is a 57 y.o male presenting s/p aortobifemoral bipass after one year of progressive vascular pain. Pt has not used an AD however reports increased difficulty with ambulation over the last year. PMH significant for PVD, HTN, and arthritis.     PT Comments    Pt reporting he has been ambulating this morning without help around the nurses station and is feeling well. Pt quick to get into standing and requiring no assistance during 300 feet of ambulation. 3 steps performed with a step to pattern descending and reciprocal ascending. Pt confident, no hesitation, and steady during stair negotiation with use of R rail. Pt asymptomatic t/o treatment. No further skilled PT needs required pt is well and ambulating safely/independently. Will complete PT order.     Follow Up Recommendations  No PT follow up     Equipment Recommendations       Recommendations for Other Services       Precautions / Restrictions Restrictions Weight Bearing Restrictions: No    Mobility  Bed Mobility Overal bed mobility: Independent Bed Mobility: Supine to Sit     Supine to sit: Independent        Transfers Overall transfer level: Independent   Transfers: Sit to/from Stand Sit to Stand: Independent            Ambulation/Gait Ambulation/Gait assistance: Supervision Gait Distance (Feet): 300 Feet Assistive device: None       General Gait Details: Pt ambulating with no physical assist, no AD, no hesitation, and no instabilities or evident balance concerns.    Stairs Stairs: Yes Stairs assistance: Independent Stair Management: One rail Right Number of Stairs: 3 General stair comments: Step to descending and reciprocal ascending. No balance concerns pt confident t/o.    Wheelchair Mobility    Modified Rankin (Stroke Patients  Only)       Balance Overall balance assessment: No apparent balance deficits (not formally assessed)                                          Cognition Arousal/Alertness: Awake/alert Behavior During Therapy: WFL for tasks assessed/performed Overall Cognitive Status: Within Functional Limits for tasks assessed                                        Exercises      General Comments General comments (skin integrity, edema, etc.): BP 143/93 in sitting prior to activity.       Pertinent Vitals/Pain Pain Assessment: No/denies pain    Home Living                      Prior Function            PT Goals (current goals can now be found in the care plan section) Progress towards PT goals: Goals met/education completed, patient discharged from PT    Frequency           PT Plan Discharge plan needs to be updated    Co-evaluation              AM-PAC PT "6 Clicks" Daily Activity  Outcome Measure  Difficulty turning over in   bed (including adjusting bedclothes, sheets and blankets)?: None Difficulty moving from lying on back to sitting on the side of the bed? : None Difficulty sitting down on and standing up from a chair with arms (e.g., wheelchair, bedside commode, etc,.)?: None Help needed moving to and from a bed to chair (including a wheelchair)?: None Help needed walking in hospital room?: None Help needed climbing 3-5 steps with a railing? : A Little 6 Click Score: 23    End of Session Equipment Utilized During Treatment: Gait belt Activity Tolerance: Patient tolerated treatment well Patient left: in bed;with call bell/phone within reach Nurse Communication: Mobility status PT Visit Diagnosis: Difficulty in walking, not elsewhere classified (R26.2);Muscle weakness (generalized) (M62.81);Unsteadiness on feet (R26.81)     Time: 2620-3559 PT Time Calculation (min) (ACUTE ONLY): 10 min  Charges:                         Ernie Avena, SPT 11/18/2017, 10:32 AM

## 2017-11-18 NOTE — Progress Notes (Signed)
[facility] - [place] at [facility]      PATIENT NAME: Kyle Gordon    MR#:  [id number]  DATE OF BIRTH:  09/27/1960  SUBJECTIVE:   Patient is status post aortobifemoral bypass for critical limb ischemia postop day #5 today.  Denies any pain.  Blood pressure is better controlled.  No other acute events overnight.  REVIEW OF SYSTEMS:    Review of Systems  Constitutional: Negative for chills and fever.  HENT: Negative for congestion and tinnitus.   Eyes: Negative for blurred vision and double vision.  Respiratory: Negative for cough, shortness of breath and wheezing.   Cardiovascular: Negative for chest pain, orthopnea and PND.  Gastrointestinal: Negative for abdominal pain, diarrhea, nausea and vomiting.  Genitourinary: Negative for dysuria and hematuria.  Neurological: Negative for dizziness, sensory change and focal weakness.  All other systems reviewed and are negative.   Nutrition: Regular Tolerating Diet: yes Tolerating PT:  Ambulatory  DRUG ALLERGIES:  No Known Allergies  VITALS:  Blood pressure (!) 142/81, pulse 78, temperature 98 F (36.7 C), temperature source Oral, resp. rate 20, height 5\' 7"  (1.702 m), weight 56.5 kg, SpO2 98 %.  PHYSICAL EXAMINATION:   Physical Exam  GENERAL:  57 y.o.-year-old patient lying in bed in no acute distress.  EYES: Pupils equal, round, reactive to light and accommodation. No scleral icterus. Extraocular muscles intact.  HEENT: Head atraumatic, normocephalic. Oropharynx and nasopharynx clear.  NECK:  Supple, no jugular venous distention. No thyroid enlargement, no tenderness.  LUNGS: Normal breath sounds bilaterally, no wheezing, rales, rhonchi. No use of accessory muscles of respiration.  CARDIOVASCULAR: S1, S2 normal. No murmurs, rubs, or gallops.  ABDOMEN: Soft, nontender, nondistended. Bowel sounds present. No organomegaly or mass. Mid-abdomen dressing with no acute bleeding noted.  EXTREMITIES: No  cyanosis, clubbing or edema b/l.    NEUROLOGIC: Cranial nerves II through XII are intact. No focal Motor or sensory deficits b/l.  PSYCHIATRIC: The patient is alert and oriented x 3.  SKIN: No obvious rash, lesion, or ulcer.    LABORATORY PANEL:   CBC Recent Labs  Lab 11/18/17 0712  WBC 4.6  HGB 9.9*  HCT 28.3*  PLT 211   ------------------------------------------------------------------------------------------------------------------  Chemistries  Recent Labs  Lab 11/18/17 0712  NA 138  K 3.1*  CL 105  CO2 24  GLUCOSE 100*  BUN 5*  CREATININE 0.63  CALCIUM 8.2*  MG 1.8   ------------------------------------------------------------------------------------------------------------------  Cardiac Enzymes No results for input(s): TROPONINI in the last 168 hours. ------------------------------------------------------------------------------------------------------------------  RADIOLOGY:  No results found.   ASSESSMENT AND PLAN:   57 year old male with past medical history of essential hypertension, peripheral vascular disease, osteoarthritis who had a aortobifemoral bypass secondary to critical limb ischemia and postoperatively noted to be significantly hypertensive.  1.  Accelerated hypertension- patient was not on any antihypertensives prior to coming to the hospital. -Much improved now, continue labetalol, Norvasc. - We will discharge him on those medications as mentioned.  2.  Status post aortobifemoral bypass postop day #5-continue pain control and further care as per vascular surgery. -Continue aspirin, Plavix.  3. Anemia of chronic Disease - cont. Ferrous sulfate.   4. GERD - cont. Pepcid.   Okay to be discharged from the medical standpoint and discussed with vascular surgery and will sign off.  Discussed with Dr. Wyn Quaker.   All the records are reviewed and case discussed with Care Management/Social Worker. Management plans discussed with the patient,  family and they are in agreement.  CODE  STATUS: Full code  DVT Prophylaxis: Lovenox  TOTAL TIME TAKING CARE OF THIS PATIENT: 25 minutes.   POSSIBLE D/C today   Houston Siren M.D on 11/18/2017 at 12:17 PM  Between 7am to 6pm - Pager - [phone]  After 6pm go to [email or web address] - Social research officer, government  [facility] [place] [facility]  Office  [phone]  CC: Primary care physician; Patient, No Pcp Per

## 2017-11-18 NOTE — Care Management (Addendum)
Care manager met with patient this morning.  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  His wife had a stroke four months ago and no longer has insurance.  Provided patient with application for [facility].  Discussed the urgency of getting them completed. Discussed that CM would send his discharge prescriptions to [facility] and left sticky note for attending and unit staff to notify CM at discharge of medications.  Patient discharged without knowledge of CM and no arrangements made to assure patient would be able to obtain his discharge meds.  CM contacted patient.  Says meds are at [facility] and he will be able to pay for them.  Meds- clopidogrel, labetalol, atorvastatin and amlodipine are on the [facility] Generic list.  Again discussed with patient the importance and benefits of completing the assistance applications that were provided

## 2017-11-20 ENCOUNTER — Telehealth: Payer: Self-pay

## 2017-11-20 NOTE — Telephone Encounter (Signed)
EMMI Follow-up: Patient was listed on the EMMI Future patient list and his mother, Jamarri Vuncannon called and said she had received a call from my number and provided me with her son's phone number.  I talked with Mr. Cedillos and let him know there are two automated calls post discharge and the first one went to his mother.  I asked and he said he had Rx's filled and was aware of follow-up appointment as it was listed on his discharge paperwork. Said he was feeling better.  I asked if he would like me to update his phone number in the system to show his number as primary so he would receive the next call and he said yes.  Update completed.  No other needs noted.

## 2017-11-25 ENCOUNTER — Ambulatory Visit: Payer: Self-pay

## 2017-11-25 ENCOUNTER — Telehealth: Payer: Self-pay

## 2017-11-25 NOTE — Telephone Encounter (Signed)
Flagged on EMMI report for not knowing who to call about changes in condition.  Called and spoke with patient.  He is aware of his appointment on 10/31 with [facility].  Patient was also given application for [facility].  Reviewed eligibility.  Patient mentioned he will try to stop by this week to turn in items.  No further questions or concerns.

## 2017-11-28 ENCOUNTER — Telehealth: Payer: Self-pay | Admitting: Pharmacy Technician

## 2017-11-28 ENCOUNTER — Ambulatory Visit (INDEPENDENT_AMBULATORY_CARE_PROVIDER_SITE_OTHER): Payer: Self-pay | Admitting: Nurse Practitioner

## 2017-11-28 ENCOUNTER — Encounter (INDEPENDENT_AMBULATORY_CARE_PROVIDER_SITE_OTHER): Payer: Self-pay | Admitting: Nurse Practitioner

## 2017-11-28 ENCOUNTER — Other Ambulatory Visit (INDEPENDENT_AMBULATORY_CARE_PROVIDER_SITE_OTHER): Payer: Self-pay | Admitting: Nurse Practitioner

## 2017-11-28 VITALS — BP 123/71 | HR 67 | Resp 17 | Ht 66.0 in | Wt 125.0 lb

## 2017-11-28 DIAGNOSIS — F17200 Nicotine dependence, unspecified, uncomplicated: Secondary | ICD-10-CM

## 2017-11-28 DIAGNOSIS — I70299 Other atherosclerosis of native arteries of extremities, unspecified extremity: Secondary | ICD-10-CM

## 2017-11-28 DIAGNOSIS — T8149XA Infection following a procedure, other surgical site, initial encounter: Secondary | ICD-10-CM

## 2017-11-28 DIAGNOSIS — L97909 Non-pressure chronic ulcer of unspecified part of unspecified lower leg with unspecified severity: Secondary | ICD-10-CM

## 2017-11-28 MED ORDER — OXYCODONE-ACETAMINOPHEN 5-325 MG PO TABS: 1.0000 | ORAL_TABLET | ORAL | 0 refills | Status: DC | PRN

## 2017-11-28 MED ORDER — CEPHALEXIN 500 MG PO CAPS: 500.0000 mg | ORAL_CAPSULE | Freq: Two times a day (BID) | ORAL | 0 refills | Status: DC

## 2017-11-28 NOTE — Telephone Encounter (Signed)
Provided patient with new patient packet to obtain [facility] services.  Patient understands that [facility] must receive 2019 financial documentation in order to receive medication.  Kyle Gordon J. Kyle Gordon Care Manager [facility] 

## 2017-11-28 NOTE — Progress Notes (Signed)
Subjective:    Patient ID: Kyle Gordon, male    DOB: Feb 02, 1960, 57 y.o.   MRN: [id number] Chief Complaint  Patient presents with  . Follow-up    10 day staple removal/Wound check    HPI  Kyle Gordon is a 57 y.o. male that underwent a aortobifemoral bypass with bilateral common and superficial femoral endarterectomies, bilateral profunda femoris endarterectomy as well as an aortic endarterectomy and right lower extremity angiogram on 11/13/2017.  The patient is still endorsing some severe postoperative pain especially near the lower end of his incision.  The femoral incisions appear well-healed and well approximated.  He has a large midline incision from the level of the xiphoid to the pubis.  Near the distal end of the incision there is slight oozing of purulent drainage.  The patient presents today for staple removal.  The patient denies any fever, chills, nausea, vomiting.  He denies any chest pain or shortness of breath.  He denies any claudication or rest pain like symptoms.  He states that the wound that was on his right foot is very nearly healed.  He is having some swelling of his bilateral lower extremities, the right greater than left.  Past Medical History:  Diagnosis Date  . Arthritis   . Hypertension    no meds  . Peripheral vascular disease (HCC)    pad    Past Surgical History:  Procedure Laterality Date  . AORTA - BILATERAL FEMORAL ARTERY BYPASS GRAFT Bilateral 11/13/2017   Procedure: AORTA BIFEMORAL BYPASS GRAFT;  Surgeon: Annice Needy, MD;  Location: [facility];  Service: Vascular;  Laterality: Bilateral;  . INSERTION OF ILIAC STENT Right 11/13/2017   Procedure: INSERTION OF ILIAC STENT (SFA STENT PLACEMENT );  Surgeon: Annice Needy, MD;  Location: [facility];  Service: Vascular;  Laterality: Right;  . LOWER EXTREMITY ANGIOGRAPHY Right 10/14/2017   Procedure: LOWER EXTREMITY ANGIOGRAPHY;  Surgeon: Annice Needy, MD;  Location: [facility];  Service:  Cardiovascular;  Laterality: Right;  . MOUTH SURGERY      Social History   Socioeconomic History  . Marital status: Married    Spouse name: Not on file  . Number of children: Not on file  . Years of education: Not on file  . Highest education level: Not on file  Occupational History  . Not on file  Social Needs  . Financial resource strain: Not on file  . Food insecurity:    Worry: Not on file    Inability: Not on file  . Transportation needs:    Medical: Not on file    Non-medical: Not on file  Tobacco Use  . Smoking status: Current Every Day Smoker  . Smokeless tobacco: Never Used  Substance and Sexual Activity  . Alcohol use: Yes    Comment: 3/4 BEERS PER DAY  . Drug use: Yes    Types: Marijuana  . Sexual activity: Not on file  Lifestyle  . Physical activity:    Days per week: Not on file    Minutes per session: Not on file  . Stress: Not on file  Relationships  . Social connections:    Talks on phone: Not on file    Gets together: Not on file    Attends religious service: Not on file    Active member of club or organization: Not on file    Attends meetings of clubs or organizations: Not on file    Relationship status: Not on  file  . Intimate partner violence:    Fear of current or ex partner: Not on file    Emotionally abused: Not on file    Physically abused: Not on file    Forced sexual activity: Not on file  Other Topics Concern  . Not on file  Social History Narrative  . Not on file    Family History  Problem Relation Age of Onset  . Heart attack Father     No Known Allergies   Review of Systems   Review of Systems: Negative Unless Checked Constitutional: [] Weight loss  [] Fever  [] Chills Cardiac: [] Chest pain   []  Atrial Fibrillation  [] Palpitations   [] Shortness of breath when laying flat   [] Shortness of breath with exertion. Vascular:  [] Pain in legs with walking   [] Pain in legs with standing  [] History of DVT   [] Phlebitis   [x] Swelling  in legs   [] Varicose veins   [x] Non-healing ulcers Pulmonary:   [] Uses home oxygen   [] Productive cough   [] Hemoptysis   [] Wheeze  [] COPD   [] Asthma Neurologic:  [] Dizziness   [] Seizures   [] History of stroke   [] History of TIA  [] Aphasia   [] Vissual changes   [] Weakness or numbness in arm   [] Weakness or numbness in leg Musculoskeletal:   [] Joint swelling   [] Joint pain   [] Low back pain  []  History of Knee Replacement Hematologic:  [] Easy bruising  [] Easy bleeding   [] Hypercoagulable state   [] Anemic Gastrointestinal:  [] Diarrhea   [] Vomiting  [] Gastroesophageal reflux/heartburn   [] Difficulty swallowing. Genitourinary:  [] Chronic kidney disease   [] Difficult urination  [] Anuric   [] Blood in urine Skin:  [] Rashes   [x] Ulcers  Psychological:  [] History of anxiety   []  History of major depression  []  Memory Difficulties     Objective:   Physical Exam  BP 123/71 (BP Location: Right Arm, Patient Position: Sitting)   Pulse 67   Resp 17   Ht 5\' 6"  (1.676 m)   Wt 125 lb (56.7 kg)   BMI 20.18 kg/m   Gen: WD/WN, NAD Head: [place]/AT, No temporalis wasting.  Ear/Nose/Throat: Hearing grossly intact, nares w/o erythema or drainage Eyes: PER, EOMI, sclera nonicteric.  Neck: Supple, no masses.  No JVD.  Pulmonary:  Good air movement, no use of accessory muscles.  Cardiac: RRR Vascular:  +2 pitting edema on the right lower extremity, +1 on left.  Femoral incisions clean, dry, intact and well approximated.  Midline incision is well approximated and some areas however near distal and it is oozing purulent drainage. Vessel Right Left  Radial Palpable Palpable   Gastrointestinal: soft, non-distended. No guarding/no peritoneal signs.  Musculoskeletal: M/S 5/5 throughout.  No deformity or atrophy.  Neurologic: Pain and light touch intact in extremities.  Symmetrical.  Speech is fluent. Motor exam as listed above. Psychiatric: Judgment intact, Mood & affect appropriate for pt's clinical  situation. Dermatologic: No Venous rashes.  Right foot ulceration appears much better.Marland Kitchen  No changes consistent with cellulitis. Lymph : No Cervical lymphadenopathy, no lichenification or skin changes of chronic lymphedema.      Assessment & Plan:   1. Surgical site infection Patient had small area between staples that had a scant amount of purulent drainage.  After staple removal, the drainage increased and continued to be purulent.  We will give a short dose of antibiotics to ensure no worsening surgical site infection.  We will assess the site in 10 days with the removal of the rest of  the staples.  - cephALEXin (KEFLEX) 500 MG capsule; Take 1 capsule (500 mg total) by mouth 2 (two) times daily.  Dispense: 20 capsule; Refill: 0  2. Atherosclerosis of artery of extremity with ulceration (HCC) The patient continues to have severe pain from his midline abdominal incision.    The wound is well approximated and some areas, not as much and others.  Today every other staple was removed with Steri-Strips placed in certain areas to promote healing.  Groin incisions well approximated nonerythematous.  We will have the patient follow-up in 10 to 11 days to remove the rest of his staples.   - oxyCODONE-acetaminophen (PERCOCET/ROXICET) 5-325 MG tablet; Take 1-2 tablets by mouth every 4 (four) hours as needed for moderate pain.  Dispense: 30 tablet; Refill: 0  3. Tobacco use disorder Patient was again advised to cease smoking.  Patient stated that he was attempting to quit, I have encouraged his efforts.    Current Outpatient Medications on File Prior to Visit  Medication Sig Dispense Refill  . amLODipine (NORVASC) 5 MG tablet Take 1 tablet (5 mg total) by mouth daily. 30 tablet 3  . aspirin EC 81 MG EC tablet Take 1 tablet (81 mg total) by mouth daily. 90 tablet 3  . atorvastatin (LIPITOR) 10 MG tablet Take 1 tablet (10 mg total) by mouth daily. 30 tablet 11  . celecoxib (CELEBREX) 100 MG  capsule Take 100 mg by mouth 2 (two) times daily. As needed    . clopidogrel (PLAVIX) 75 MG tablet Take 1 tablet (75 mg total) by mouth daily. 30 tablet 5  . docusate sodium (COLACE) 100 MG capsule Take 1 capsule (100 mg total) by mouth daily. 10 capsule 0  . Ibuprofen (ADVIL PO) Take by mouth as needed.    . labetalol (NORMODYNE) 100 MG tablet Take 1 tablet (100 mg total) by mouth 2 (two) times daily. 60 tablet 3  . traMADol (ULTRAM) 50 MG tablet Take 1 tablet (50 mg total) by mouth every 6 (six) hours as needed for severe pain. 20 tablet 0   No current facility-administered medications on file prior to visit.     There are no Patient Instructions on file for this visit. No follow-ups on file.   Georgiana Spinner, NP  This note was completed with Office manager.  Any errors are purely unintentional.

## 2017-12-09 ENCOUNTER — Encounter (INDEPENDENT_AMBULATORY_CARE_PROVIDER_SITE_OTHER): Payer: Self-pay | Admitting: Nurse Practitioner

## 2017-12-09 ENCOUNTER — Ambulatory Visit (INDEPENDENT_AMBULATORY_CARE_PROVIDER_SITE_OTHER): Payer: Self-pay | Admitting: Nurse Practitioner

## 2017-12-09 VITALS — BP 125/72 | HR 58 | Resp 16 | Ht 66.5 in | Wt 119.0 lb

## 2017-12-09 DIAGNOSIS — I7025 Atherosclerosis of native arteries of other extremities with ulceration: Secondary | ICD-10-CM

## 2017-12-09 DIAGNOSIS — T8149XA Infection following a procedure, other surgical site, initial encounter: Secondary | ICD-10-CM

## 2017-12-09 DIAGNOSIS — F17200 Nicotine dependence, unspecified, uncomplicated: Secondary | ICD-10-CM

## 2017-12-12 ENCOUNTER — Encounter (INDEPENDENT_AMBULATORY_CARE_PROVIDER_SITE_OTHER): Payer: Self-pay | Admitting: Nurse Practitioner

## 2017-12-12 NOTE — Progress Notes (Signed)
Subjective:    Patient ID: Kyle Gordon, male    DOB: Oct 20, 1960, 57 y.o.   MRN: [id number] Chief Complaint  Patient presents with  . Follow-up    10 day staple removal/wound check    HPI  Kyle Gordon is a 57 y.o. male that presents today for staple removal.  Previously every other staple was removed in his midline incision.  There was purulent drainage coming from the distal and and severe pain.  Today, there is minimal pain and no drainage present.  The patient denies any fevers, chills, nausea, vomiting.  Patient denies any chest pain or shortness of breath.  The patient has not had any issues with claudication-like symptoms.  He also reports that his ulceration is healing well.  Past Medical History:  Diagnosis Date  . Arthritis   . Hypertension    no meds  . Peripheral vascular disease (HCC)    pad    Past Surgical History:  Procedure Laterality Date  . AORTA - BILATERAL FEMORAL ARTERY BYPASS GRAFT Bilateral 11/13/2017   Procedure: AORTA BIFEMORAL BYPASS GRAFT;  Surgeon: Annice Needy, MD;  Location: [facility];  Service: Vascular;  Laterality: Bilateral;  . INSERTION OF ILIAC STENT Right 11/13/2017   Procedure: INSERTION OF ILIAC STENT (SFA STENT PLACEMENT );  Surgeon: Annice Needy, MD;  Location: [facility];  Service: Vascular;  Laterality: Right;  . LOWER EXTREMITY ANGIOGRAPHY Right 10/14/2017   Procedure: LOWER EXTREMITY ANGIOGRAPHY;  Surgeon: Annice Needy, MD;  Location: [facility];  Service: Cardiovascular;  Laterality: Right;  . MOUTH SURGERY      Social History   Socioeconomic History  . Marital status: Married    Spouse name: Not on file  . Number of children: Not on file  . Years of education: Not on file  . Highest education level: Not on file  Occupational History  . Not on file  Social Needs  . Financial resource strain: Not on file  . Food insecurity:    Worry: Not on file    Inability: Not on file  . Transportation needs:   Medical: Not on file    Non-medical: Not on file  Tobacco Use  . Smoking status: Current Every Day Smoker  . Smokeless tobacco: Never Used  Substance and Sexual Activity  . Alcohol use: Yes    Comment: 3/4 BEERS PER DAY  . Drug use: Yes    Types: Marijuana  . Sexual activity: Not on file  Lifestyle  . Physical activity:    Days per week: Not on file    Minutes per session: Not on file  . Stress: Not on file  Relationships  . Social connections:    Talks on phone: Not on file    Gets together: Not on file    Attends religious service: Not on file    Active member of club or organization: Not on file    Attends meetings of clubs or organizations: Not on file    Relationship status: Not on file  . Intimate partner violence:    Fear of current or ex partner: Not on file    Emotionally abused: Not on file    Physically abused: Not on file    Forced sexual activity: Not on file  Other Topics Concern  . Not on file  Social History Narrative  . Not on file    Family History  Problem Relation Age of Onset  . Heart attack Father  No Known Allergies   Review of Systems   Review of Systems: Negative Unless Checked Constitutional: [] Weight loss  [] Fever  [] Chills Cardiac: [] Chest pain   []  Atrial Fibrillation  [] Palpitations   [] Shortness of breath when laying flat   [] Shortness of breath with exertion. Vascular:  [] Pain in legs with walking   [] Pain in legs with standing  [] History of DVT   [] Phlebitis   [] Swelling in legs   [] Varicose veins   [] Non-healing ulcers Pulmonary:   [] Uses home oxygen   [] Productive cough   [] Hemoptysis   [] Wheeze  [] COPD   [] Asthma Neurologic:  [] Dizziness   [] Seizures   [] History of stroke   [] History of TIA  [] Aphasia   [] Vissual changes   [] Weakness or numbness in arm   [] Weakness or numbness in leg Musculoskeletal:   [] Joint swelling   [x] Joint pain   [x] Low back pain  []  History of Knee Replacement Hematologic:  [] Easy bruising  [] Easy  bleeding   [] Hypercoagulable state   [] Anemic Gastrointestinal:  [] Diarrhea   [] Vomiting  [] Gastroesophageal reflux/heartburn   [] Difficulty swallowing. Genitourinary:  [] Chronic kidney disease   [] Difficult urination  [] Anuric   [] Blood in urine Skin:  [] Rashes   [] Ulcers  Psychological:  [] History of anxiety   []  History of major depression  []  Memory Difficulties     Objective:   Physical Exam  BP 125/72 (BP Location: Right Arm, Patient Position: Sitting)   Pulse (!) 58   Resp 16   Ht 5' 6.5" (1.689 m)   Wt 119 lb (54 kg)   BMI 18.92 kg/m   Gen: WD/WN, NAD Head: [address]/AT, No temporalis wasting.  Ear/Nose/Throat: Hearing grossly intact, nares w/o erythema or drainage Eyes: PER, EOMI, sclera nonicteric.  Neck: Supple, no masses.  No JVD.  Pulmonary:  Good air movement, no use of accessory muscles.  Cardiac: RRR Vascular:  Well approximated midline incision.  No purulent drainage present. Vessel Right Left  Radial Palpable Palpable   Gastrointestinal: soft, non-distended. No guarding/no peritoneal signs.  Musculoskeletal: M/S 5/5 throughout.  No deformity or atrophy.  Neurologic: Pain and light touch intact in extremities.  Symmetrical.  Speech is fluent. Motor exam as listed above. Psychiatric: Judgment intact, Mood & affect appropriate for pt's clinical situation. Dermatologic: No Venous rashes. No Ulcers Noted.  No changes consistent with cellulitis. Lymph : No Cervical lymphadenopathy, no lichenification or skin changes of chronic lymphedema.      Assessment & Plan:   1. Atherosclerosis of native arteries of the extremities with ulceration (HCC) The patient denies any continued claudication-like pain or rest pain symptoms.  The ulceration that he had on his lower extremity has begun to heal.  We will follow-up with patient in 1 month for bilateral ABIs.  Patient is still cautioned not to do heavy lifting as the wound is still continuing to heal.  Patient understood.  2.  Surgical site infection Surgical site appears much better following a 10-day course of antibiotics.  There is no purulent drainage.  The patient has much less pain in the area where the purulent drainage was.  3. Tobacco use disorder Smoking cessation was discussed, 10  minutes spent on this topic specifically.  We discussed options such as Chantix, nicotine patches and gum, psychotherapy, and cold [place].    Current Outpatient Medications on File Prior to Visit  Medication Sig Dispense Refill  . amLODipine (NORVASC) 5 MG tablet Take 1 tablet (5 mg total) by mouth daily. 30 tablet 3  .  aspirin EC 81 MG EC tablet Take 1 tablet (81 mg total) by mouth daily. 90 tablet 3  . atorvastatin (LIPITOR) 10 MG tablet Take 1 tablet (10 mg total) by mouth daily. 30 tablet 11  . celecoxib (CELEBREX) 100 MG capsule Take 100 mg by mouth 2 (two) times daily. As needed    . cephALEXin (KEFLEX) 500 MG capsule Take 1 capsule (500 mg total) by mouth 2 (two) times daily. 20 capsule 0  . clopidogrel (PLAVIX) 75 MG tablet Take 1 tablet (75 mg total) by mouth daily. 30 tablet 5  . docusate sodium (COLACE) 100 MG capsule Take 1 capsule (100 mg total) by mouth daily. 10 capsule 0  . Ibuprofen (ADVIL PO) Take by mouth as needed.    . labetalol (NORMODYNE) 100 MG tablet Take 1 tablet (100 mg total) by mouth 2 (two) times daily. 60 tablet 3  . oxyCODONE-acetaminophen (PERCOCET/ROXICET) 5-325 MG tablet Take 1-2 tablets by mouth every 4 (four) hours as needed for moderate pain. 30 tablet 0  . traMADol (ULTRAM) 50 MG tablet Take 1 tablet (50 mg total) by mouth every 6 (six) hours as needed for severe pain. 20 tablet 0   No current facility-administered medications on file prior to visit.     There are no Patient Instructions on file for this visit. No follow-ups on file.   Georgiana SpinnerFallon E Zykerria Tanton, NP  This note was completed with Office managerDragon Dictation.  Any errors are purely unintentional.

## 2018-01-14 ENCOUNTER — Ambulatory Visit (INDEPENDENT_AMBULATORY_CARE_PROVIDER_SITE_OTHER): Payer: Self-pay | Admitting: Vascular Surgery

## 2018-01-14 ENCOUNTER — Encounter (INDEPENDENT_AMBULATORY_CARE_PROVIDER_SITE_OTHER): Payer: Self-pay

## 2018-02-14 ENCOUNTER — Ambulatory Visit (INDEPENDENT_AMBULATORY_CARE_PROVIDER_SITE_OTHER): Payer: Self-pay | Admitting: Vascular Surgery

## 2018-02-14 ENCOUNTER — Encounter (INDEPENDENT_AMBULATORY_CARE_PROVIDER_SITE_OTHER): Payer: Self-pay

## 2018-02-14 ENCOUNTER — Ambulatory Visit (INDEPENDENT_AMBULATORY_CARE_PROVIDER_SITE_OTHER): Payer: Self-pay

## 2018-02-14 DIAGNOSIS — I7025 Atherosclerosis of native arteries of other extremities with ulceration: Secondary | ICD-10-CM

## 2018-02-25 ENCOUNTER — Encounter: Payer: Self-pay | Admitting: Vascular Surgery

## 2018-03-18 ENCOUNTER — Other Ambulatory Visit: Payer: Self-pay

## 2018-03-18 ENCOUNTER — Other Ambulatory Visit (INDEPENDENT_AMBULATORY_CARE_PROVIDER_SITE_OTHER): Payer: Self-pay | Admitting: Vascular Surgery

## 2018-03-18 ENCOUNTER — Emergency Department: Payer: Medicaid Other

## 2018-03-18 ENCOUNTER — Inpatient Hospital Stay
Admission: EM | Admit: 2018-03-18 | Discharge: 2018-03-22 | DRG: 271 | Disposition: A | Discharge status: Home / Self Care | Payer: Medicaid Other | Attending: Internal Medicine | Admitting: Internal Medicine

## 2018-03-18 DIAGNOSIS — I700 Atherosclerosis of aorta: Secondary | ICD-10-CM

## 2018-03-18 DIAGNOSIS — Z681 Body mass index (BMI) 19 or less, adult: Secondary | ICD-10-CM | POA: Diagnosis not present

## 2018-03-18 DIAGNOSIS — M1990 Unspecified osteoarthritis, unspecified site: Secondary | ICD-10-CM | POA: Diagnosis present

## 2018-03-18 DIAGNOSIS — F17200 Nicotine dependence, unspecified, uncomplicated: Secondary | ICD-10-CM | POA: Diagnosis present

## 2018-03-18 DIAGNOSIS — I70223 Atherosclerosis of native arteries of extremities with rest pain, bilateral legs: Secondary | ICD-10-CM | POA: Diagnosis present

## 2018-03-18 DIAGNOSIS — Z7982 Long term (current) use of aspirin: Secondary | ICD-10-CM | POA: Diagnosis not present

## 2018-03-18 DIAGNOSIS — M79604 Pain in right leg: Secondary | ICD-10-CM | POA: Diagnosis present

## 2018-03-18 DIAGNOSIS — Z7902 Long term (current) use of antithrombotics/antiplatelets: Secondary | ICD-10-CM | POA: Diagnosis not present

## 2018-03-18 DIAGNOSIS — R64 Cachexia: Secondary | ICD-10-CM | POA: Diagnosis present

## 2018-03-18 DIAGNOSIS — I998 Other disorder of circulatory system: Secondary | ICD-10-CM | POA: Diagnosis present

## 2018-03-18 DIAGNOSIS — I10 Essential (primary) hypertension: Secondary | ICD-10-CM | POA: Diagnosis present

## 2018-03-18 DIAGNOSIS — Y838 Other surgical procedures as the cause of abnormal reaction of the patient, or of later complication, without mention of misadventure at the time of the procedure: Secondary | ICD-10-CM | POA: Diagnosis present

## 2018-03-18 DIAGNOSIS — T82868A Thrombosis of vascular prosthetic devices, implants and grafts, initial encounter: Secondary | ICD-10-CM | POA: Diagnosis present

## 2018-03-18 DIAGNOSIS — Z79899 Other long term (current) drug therapy: Secondary | ICD-10-CM

## 2018-03-18 DIAGNOSIS — I7410 Embolism and thrombosis of unspecified parts of aorta: Secondary | ICD-10-CM

## 2018-03-18 DIAGNOSIS — M79605 Pain in left leg: Secondary | ICD-10-CM

## 2018-03-18 LAB — CBC WITH DIFFERENTIAL/PLATELET
Abs Immature Granulocytes: 0.06 10*3/uL (ref 0.00–0.07)
BASOS ABS: 0 10*3/uL (ref 0.0–0.1)
Basophils Relative: 0 %
EOS PCT: 0 %
Eosinophils Absolute: 0 10*3/uL (ref 0.0–0.5)
HEMATOCRIT: 46.3 % (ref 39.0–52.0)
Hemoglobin: 15.9 g/dL (ref 13.0–17.0)
IMMATURE GRANULOCYTES: 0 %
LYMPHS ABS: 1.7 10*3/uL (ref 0.7–4.0)
LYMPHS PCT: 13 %
MCH: 34.6 pg — ABNORMAL HIGH (ref 26.0–34.0)
MCHC: 34.3 g/dL (ref 30.0–36.0)
MCV: 100.7 fL — AB (ref 80.0–100.0)
MONOS PCT: 5 %
Monocytes Absolute: 0.7 10*3/uL (ref 0.1–1.0)
NRBC: 0 % (ref 0.0–0.2)
Neutro Abs: 11 10*3/uL — ABNORMAL HIGH (ref 1.7–7.7)
Neutrophils Relative %: 82 %
Platelets: 215 10*3/uL (ref 150–400)
RBC: 4.6 MIL/uL (ref 4.22–5.81)
RDW: 13.6 % (ref 11.5–15.5)
WBC: 13.4 10*3/uL — ABNORMAL HIGH (ref 4.0–10.5)

## 2018-03-18 LAB — COMPREHENSIVE METABOLIC PANEL
ALK PHOS: 99 U/L (ref 38–126)
ALT: 16 U/L (ref 0–44)
ANION GAP: 11 (ref 5–15)
AST: 30 U/L (ref 15–41)
Albumin: 4.3 g/dL (ref 3.5–5.0)
BUN: 7 mg/dL (ref 6–20)
CALCIUM: 9.6 mg/dL (ref 8.9–10.3)
CO2: 22 mmol/L (ref 22–32)
CREATININE: 0.72 mg/dL (ref 0.61–1.24)
Chloride: 104 mmol/L (ref 98–111)
GFR calc Af Amer: >60 mL/min (ref >60)
GLUCOSE: 198 mg/dL — AB (ref 70–99)
Potassium: 3.6 mmol/L (ref 3.5–5.1)
SODIUM: 137 mmol/L (ref 135–145)
Total Bilirubin: 1.1 mg/dL (ref 0.3–1.2)
Total Protein: 8.4 g/dL — ABNORMAL HIGH (ref 6.5–8.1)

## 2018-03-18 LAB — PROTIME-INR
INR: 0.96
Prothrombin Time: 12.7 seconds (ref 11.4–15.2)

## 2018-03-18 LAB — TSH: TSH: 1.546 u[IU]/mL (ref 0.350–4.500)

## 2018-03-18 LAB — HEPARIN LEVEL (UNFRACTIONATED): Heparin Unfractionated: 0.66 IU/mL (ref 0.30–0.70)

## 2018-03-18 LAB — APTT: aPTT: 126 seconds — ABNORMAL HIGH (ref 24–36)

## 2018-03-18 MED ORDER — IOHEXOL 350 MG/ML SOLN
100.0000 mL | Freq: Once | INTRAVENOUS | Status: AC | PRN
  Administered 2018-03-18: 100 mL via INTRAVENOUS

## 2018-03-18 MED ORDER — THIAMINE HCL 100 MG/ML IJ SOLN
100.0000 mg | Freq: Every day | INTRAMUSCULAR | Status: DC
  Administered 2018-03-20: 100 mg via INTRAVENOUS
  Filled 2018-03-18 (×2): qty 2

## 2018-03-18 MED ORDER — METOPROLOL TARTRATE 5 MG/5ML IV SOLN
5.0000 mg | Freq: Four times a day (QID) | INTRAVENOUS | Status: DC | PRN
  Administered 2018-03-19: 5 mg via INTRAVENOUS
  Filled 2018-03-18: qty 5

## 2018-03-18 MED ORDER — DOCUSATE SODIUM 100 MG PO CAPS
100.0000 mg | ORAL_CAPSULE | Freq: Two times a day (BID) | ORAL | Status: DC
  Administered 2018-03-18 – 2018-03-22 (×4): 100 mg via ORAL
  Filled 2018-03-18 (×3): qty 1

## 2018-03-18 MED ORDER — HEPARIN BOLUS VIA INFUSION
3000.0000 [IU] | Freq: Once | INTRAVENOUS | Status: AC
  Administered 2018-03-18: 3000 [IU] via INTRAVENOUS
  Filled 2018-03-18: qty 3000

## 2018-03-18 MED ORDER — ASPIRIN EC 81 MG PO TBEC
81.0000 mg | DELAYED_RELEASE_TABLET | Freq: Every day | ORAL | Status: DC
  Administered 2018-03-18 – 2018-03-22 (×4): 81 mg via ORAL
  Filled 2018-03-18 (×3): qty 1

## 2018-03-18 MED ORDER — METOPROLOL TARTRATE 5 MG/5ML IV SOLN
5.0000 mg | Freq: Once | INTRAVENOUS | Status: AC
  Administered 2018-03-18: 5 mg via INTRAVENOUS
  Filled 2018-03-18: qty 5

## 2018-03-18 MED ORDER — SODIUM CHLORIDE 0.9 % IV SOLN
INTRAVENOUS | Status: DC
  Administered 2018-03-19 – 2018-03-21 (×3): via INTRAVENOUS

## 2018-03-18 MED ORDER — LORAZEPAM 2 MG/ML IJ SOLN: 1.0000 mg | Freq: Four times a day (QID) | INTRAMUSCULAR | Status: AC | PRN

## 2018-03-18 MED ORDER — NICOTINE 14 MG/24HR TD PT24
14.0000 mg | MEDICATED_PATCH | Freq: Every day | TRANSDERMAL | Status: DC
  Administered 2018-03-18 – 2018-03-22 (×4): 14 mg via TRANSDERMAL
  Filled 2018-03-18 (×4): qty 1

## 2018-03-18 MED ORDER — HEPARIN (PORCINE) 25000 UT/250ML-% IV SOLN
900.0000 [IU]/h | INTRAVENOUS | Status: DC
  Administered 2018-03-18: 900 [IU]/h via INTRAVENOUS
  Filled 2018-03-18: qty 250

## 2018-03-18 MED ORDER — ONDANSETRON HCL 4 MG/2ML IJ SOLN: 4.0000 mg | Freq: Four times a day (QID) | INTRAMUSCULAR | Status: DC | PRN

## 2018-03-18 MED ORDER — MORPHINE SULFATE (PF) 4 MG/ML IV SOLN
3.0000 mg | INTRAVENOUS | Status: DC | PRN
  Administered 2018-03-19 – 2018-03-21 (×4): 3 mg via INTRAVENOUS
  Filled 2018-03-18 (×4): qty 1

## 2018-03-18 MED ORDER — VITAMIN B-1 100 MG PO TABS
100.0000 mg | ORAL_TABLET | Freq: Every day | ORAL | Status: DC
  Administered 2018-03-18 – 2018-03-22 (×4): 100 mg via ORAL
  Filled 2018-03-18 (×4): qty 1

## 2018-03-18 MED ORDER — HYDROMORPHONE HCL 1 MG/ML IJ SOLN
1.0000 mg | Freq: Once | INTRAMUSCULAR | Status: AC
  Administered 2018-03-18: 1 mg via INTRAVENOUS
  Filled 2018-03-18: qty 1

## 2018-03-18 MED ORDER — ADULT MULTIVITAMIN W/MINERALS CH
1.0000 | ORAL_TABLET | Freq: Every day | ORAL | Status: DC
  Administered 2018-03-18 – 2018-03-22 (×4): 1 via ORAL
  Filled 2018-03-18 (×4): qty 1

## 2018-03-18 MED ORDER — POLYETHYLENE GLYCOL 3350 17 G PO PACK: 17.0000 g | PACK | Freq: Every day | ORAL | Status: DC | PRN

## 2018-03-18 MED ORDER — ONDANSETRON HCL 4 MG PO TABS: 4.0000 mg | ORAL_TABLET | Freq: Four times a day (QID) | ORAL | Status: DC | PRN

## 2018-03-18 MED ORDER — SODIUM CHLORIDE 0.9 % IV BOLUS
1000.0000 mL | Freq: Once | INTRAVENOUS | Status: AC
  Administered 2018-03-18: 1000 mL via INTRAVENOUS

## 2018-03-18 MED ORDER — OXYCODONE-ACETAMINOPHEN 5-325 MG PO TABS
1.0000 | ORAL_TABLET | ORAL | Status: DC | PRN
  Administered 2018-03-18 – 2018-03-19 (×2): 1 via ORAL
  Administered 2018-03-21: 2 via ORAL
  Filled 2018-03-18: qty 1
  Filled 2018-03-18: qty 2
  Filled 2018-03-18: qty 1

## 2018-03-18 MED ORDER — ACETAMINOPHEN 325 MG PO TABS
650.0000 mg | ORAL_TABLET | Freq: Four times a day (QID) | ORAL | Status: DC | PRN
  Administered 2018-03-20: 650 mg via ORAL
  Filled 2018-03-18: qty 2

## 2018-03-18 MED ORDER — CLOPIDOGREL BISULFATE 75 MG PO TABS
75.0000 mg | ORAL_TABLET | Freq: Every day | ORAL | Status: DC
  Administered 2018-03-18 – 2018-03-19 (×2): 75 mg via ORAL
  Filled 2018-03-18 (×2): qty 1

## 2018-03-18 MED ORDER — AMLODIPINE BESYLATE 5 MG PO TABS
5.0000 mg | ORAL_TABLET | Freq: Every day | ORAL | Status: DC
  Administered 2018-03-18 – 2018-03-22 (×4): 5 mg via ORAL
  Filled 2018-03-18 (×4): qty 1

## 2018-03-18 MED ORDER — ACETAMINOPHEN 650 MG RE SUPP: 650.0000 mg | Freq: Four times a day (QID) | RECTAL | Status: DC | PRN

## 2018-03-18 MED ORDER — DOCUSATE SODIUM 100 MG PO CAPS
100.0000 mg | ORAL_CAPSULE | Freq: Every day | ORAL | Status: DC
  Administered 2018-03-18 – 2018-03-19 (×2): 100 mg via ORAL
  Filled 2018-03-18 (×2): qty 1

## 2018-03-18 MED ORDER — DEXTROSE-NACL 5-0.45 % IV SOLN
INTRAVENOUS | Status: DC
  Administered 2018-03-18: 22:00:00 via INTRAVENOUS

## 2018-03-18 MED ORDER — FOLIC ACID 1 MG PO TABS
1.0000 mg | ORAL_TABLET | Freq: Every day | ORAL | Status: DC
  Administered 2018-03-18 – 2018-03-19 (×2): 1 mg via ORAL
  Filled 2018-03-18 (×2): qty 1

## 2018-03-18 MED ORDER — ATORVASTATIN CALCIUM 10 MG PO TABS
10.0000 mg | ORAL_TABLET | Freq: Every day | ORAL | Status: DC
  Administered 2018-03-18 – 2018-03-22 (×4): 10 mg via ORAL
  Filled 2018-03-18 (×4): qty 1

## 2018-03-18 MED ORDER — ALBUTEROL SULFATE (2.5 MG/3ML) 0.083% IN NEBU
2.5000 mg | INHALATION_SOLUTION | Freq: Four times a day (QID) | RESPIRATORY_TRACT | Status: DC
  Administered 2018-03-18 – 2018-03-21 (×9): 2.5 mg via RESPIRATORY_TRACT
  Filled 2018-03-18 (×10): qty 3

## 2018-03-18 MED ORDER — LORAZEPAM 1 MG PO TABS
1.0000 mg | ORAL_TABLET | Freq: Four times a day (QID) | ORAL | Status: AC | PRN
  Administered 2018-03-19: 1 mg via ORAL
  Filled 2018-03-18: qty 1

## 2018-03-18 MED ORDER — CEFAZOLIN SODIUM-DEXTROSE 2-4 GM/100ML-% IV SOLN
2.0000 g | INTRAVENOUS | Status: AC
  Filled 2018-03-18: qty 100

## 2018-03-18 MED ORDER — LABETALOL HCL 100 MG PO TABS
100.0000 mg | ORAL_TABLET | Freq: Two times a day (BID) | ORAL | Status: DC
  Administered 2018-03-19 – 2018-03-22 (×6): 100 mg via ORAL
  Filled 2018-03-18 (×10): qty 1

## 2018-03-18 NOTE — ED Notes (Signed)
Assumed care of patient reports pain 9/10 to bilateral lower extremities. Patient hypertensive and tacky cardiac on monitor. Md aware pain control ordered. Heparin drip started. Awaiting CT.

## 2018-03-18 NOTE — ED Triage Notes (Signed)
Pt comes into the ED via EMS from home with c/o BL LE pain with numbness and weakness since Sunday night, pt states he has a hx of abd aortic blockage with bypass in October 2019, states this feels similar. States he has been having to take care of his wife who recently had a stroke and has to lift her

## 2018-03-18 NOTE — Consult Note (Signed)
ANTICOAGULATION CONSULT NOTE - Initial Consult  Pharmacy Consult for heparin drip management Indication: arterial occlusion / aortic bypass graft  No Known Allergies  Patient Measurements: Height: 5\' 7"  (170.2 cm) Weight: 120 lb (54.4 kg) IBW/kg (Calculated) : 66.1  Vital Signs: Temp: 98.6 F (37 C) (02/18 2146) Temp Source: Oral (02/18 2146) BP: 157/100 (02/18 2255) Pulse Rate: 99 (02/18 2255)  Labs: Recent Labs    03/18/18 1421 03/18/18 1635 03/18/18 2200  HGB 15.9  --   --   HCT 46.3  --   --   PLT 215  --   --   APTT  --  126*  --   LABPROT 12.7  --   --   INR 0.96  --   --   HEPARINUNFRC  --   --  0.66  CREATININE 0.72  --   --     Estimated Creatinine Clearance: 79.3 mL/min (by C-G formula based on SCr of 0.72 mg/dL).   Medical History: Past Medical History:  Diagnosis Date  . Arthritis   . Hypertension    no meds  . Peripheral vascular disease (HCC)    pad    Assessment: 57 YOM comes into the ED via EMS from home with c/o BL LE pain with numbness and weakness since Sunday night. He states he has a hx of abd aortic blockage with bypass in October 2019, states this feels similar. CT not completed as of this note. He is on no chronic anticoagulation PTA. Baseline labs have been ordered but not yet resulted.  Goal of Therapy:  Heparin level 0.3-0.7 units/ml Monitor platelets by anticoagulation protocol: Yes   Plan:  Give 3000 units bolus x 1 Start heparin infusion at 900 units/hr Check anti-Xa level in 6 hours and daily while on heparin Continue to monitor H&H and platelets   2/18 PM heparin level 0.66. Continue current regimen. Recheck with AM labs to confirm.  Issac Moure S, PharmD 03/18/2018,11:06 PM

## 2018-03-18 NOTE — ED Notes (Signed)
APTT drawn and sent.

## 2018-03-18 NOTE — Progress Notes (Signed)
   03/18/18 1413  Clinical Encounter Type  Visited With Patient  Visit Type Initial  Spiritual Encounters  Spiritual Needs Prayer  Stress Factors  Patient Stress Factors Health changes  Advance Directives (For Healthcare)  Does Patient Have a Medical Advance Directive? No  Would patient like information on creating a medical advance directive? No - Patient declined  Mental Health Advance Directives  Does Patient Have a Mental Health Advance Directive? No  Would patient like information on creating a mental health advance directive? No - Patient declined   Chaplain visited with the upon recommendation from Patient Care. The patient was awake, alert, and oriented upon arrival. He indicated pain in his legs. He has also been caring for his wife, who recently had a stroke and became "broken down" in the process of taking care of her. The patient was open to prayer and requested it for the aforementioned concerns as well as for his own health. Chaplain obliged and the patient expressed gratitude.

## 2018-03-18 NOTE — Consult Note (Signed)
ANTICOAGULATION CONSULT NOTE - Initial Consult  Pharmacy Consult for heparin drip management Indication: arterial occlusion / aortic bypass graft  No Known Allergies  Patient Measurements: Height: 5\' 7"  (170.2 cm) Weight: 120 lb (54.4 kg) IBW/kg (Calculated) : 66.1  Vital Signs: Temp: 97.8 F (36.6 C) (02/18 1412) Temp Source: Oral (02/18 1412) BP: 207/107 (02/18 1421) Pulse Rate: 130 (02/18 1421)  Labs: Recent Labs    03/18/18 1421  HGB 15.9  HCT 46.3  PLT 215  CREATININE 0.72    Estimated Creatinine Clearance: 79.3 mL/min (by C-G formula based on SCr of 0.72 mg/dL).   Medical History: Past Medical History:  Diagnosis Date  . Arthritis   . Hypertension    no meds  . Peripheral vascular disease (HCC)    pad    Assessment: 57 YOM comes into the ED via EMS from home with c/o BL LE pain with numbness and weakness since Sunday night. He states he has a hx of abd aortic blockage with bypass in October 2019, states this feels similar. CT not completed as of this note. He is on no chronic anticoagulation PTA. Baseline labs have been ordered but not yet resulted.  Goal of Therapy:  Heparin level 0.3-0.7 units/ml Monitor platelets by anticoagulation protocol: Yes   Plan:  Give 3000 units bolus x 1 Start heparin infusion at 900 units/hr Check anti-Xa level in 6 hours and daily while on heparin Continue to monitor H&H and platelets  Lowella Bandy, PharmD 03/18/2018,2:47 PM

## 2018-03-18 NOTE — ED Notes (Signed)
Returned from ct. nd bolus completed.

## 2018-03-18 NOTE — Progress Notes (Signed)
Family Meeting Note  Advance Directive:yes  Today a meeting took place with the Patient.  Patient is able to participate   The following clinical team members were present during this meeting:MD  The following were discussed:Patient's diagnosis: Acute aortic occlusion, Patient's progosis: Unable to determine and Goals for treatment: Full Code  Additional follow-up to be provided: prn  Time spent during discussion:20 minutes  Bertrum Sol, MD

## 2018-03-18 NOTE — ED Provider Notes (Signed)
-----------------------------------------   4:01 PM on 03/18/2018 -----------------------------------------  Patient was signed out to me at 3:00 already admitted, he is under the care of the hospitalist service at this time, patient had a CT scan that was pending, a call from radiology was directed to me indicating that the patient has a aortic occlusion, I am calling Dr. Wyn Quaker and Dr. Katheren Shams the hospitalist to make them aware of these findings in their patient.  ----------------------------------------- 4:08 PM on 03/18/2018 -----------------------------------------  Dr. Katheren Shams aware of findings.  ----------------------------------------- 4:16 PM on 03/18/2018 ----------------------------------------- Dw the findings with Dr.Dew,   He recommends the patient be kept n.p.o. after midnight and on a heparin drip, I have relayed this information to the hospitalist service who have admitted him.   Jeanmarie Plant, MD 03/18/18 1616

## 2018-03-18 NOTE — ED Notes (Signed)
RN unsucessfully attempted to doppler pt pulses. EDP at the bedside for doppler

## 2018-03-18 NOTE — ED Notes (Signed)
Unable to obtain pedal pulses with doppler. Charge nurse called

## 2018-03-18 NOTE — Consult Note (Signed)
[facility] Vascular Consult Note  MRN : [id number]  Kyle Gordon is a 58 y.o. (1960/08/10) male who presents with chief complaint of  Chief Complaint  Patient presents with  . Leg Pain   History of Present Illness:  The patient is a 58 year old male with a past medical history of hypertension, arthritis, active tobacco use and peripheral artery disease.  The patient is status post an aortobifemoral bypass on 11/13/17.  The patient presents to the [facility] emergency department today with 2 days of progressively worsening bilateral lower extremity pain.  The patient endorses a history of bending over to pick up a heavy object approximately 2 days ago and acutely experiencing pain to the bilateral legs.  The patients pain had progressively worsened over the last 2 days prompting him to seek medical attention in our emergency department.  Patient notes that the pain is constant and occurs with and without activity.  Patient denies any ulcer formation to the bilateral legs or feet.  Patient notes that his feet feel cold to him.  Denies any shortness of breath or chest pain. Denies any fever, nausea or vomiting.  CTA completed today was notable for:  1. 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 2. Reconstitution of the deep femoral arteries bilaterally. Flow in the distal right SFA and right popliteal artery. Reconstitution of the mid left popliteal artery.  3. Severe left runoff disease. No significant flow below the mid left calf. Right runoff disease with single-vessel runoff to the right ankle from the right posterior tibial artery. 4. Bilateral renal artery stenosis.  Vascular surgery was consulted by Dr. Katheren Shams for further recommendations.  Current Facility-Administered Medications  Medication Dose Route Frequency Provider Last Rate Last Dose  . heparin ADULT infusion 100 units/mL  (25000 units/254mL sodium chloride 0.45%)  900 Units/hr Intravenous Continuous Lowella Bandy, RPH 9 mL/hr at 03/18/18 1516 900 Units/hr at 03/18/18 1516  . metoprolol tartrate (LOPRESSOR) injection 5 mg  5 mg Intravenous Q6H PRN Salary, Montell D, MD      . metoprolol tartrate (LOPRESSOR) injection 5 mg  5 mg Intravenous Once Salary, Evelena Asa, MD       Current Outpatient Medications  Medication Sig Dispense Refill  . amLODipine (NORVASC) 5 MG tablet Take 1 tablet (5 mg total) by mouth daily. 30 tablet 3  . aspirin EC 81 MG EC tablet Take 1 tablet (81 mg total) by mouth daily. 90 tablet 3  . atorvastatin (LIPITOR) 10 MG tablet Take 1 tablet (10 mg total) by mouth daily. 30 tablet 11  . clopidogrel (PLAVIX) 75 MG tablet Take 1 tablet (75 mg total) by mouth daily. 30 tablet 5  . labetalol (NORMODYNE) 100 MG tablet Take 1 tablet (100 mg total) by mouth 2 (two) times daily. 60 tablet 3  . cephALEXin (KEFLEX) 500 MG capsule Take 1 capsule (500 mg total) by mouth 2 (two) times daily. (Patient not taking: Reported on 03/18/2018) 20 capsule 0  . docusate sodium (COLACE) 100 MG capsule Take 1 capsule (100 mg total) by mouth daily. (Patient not taking: Reported on 03/18/2018) 10 capsule 0  . oxyCODONE-acetaminophen (PERCOCET/ROXICET) 5-325 MG tablet Take 1-2 tablets by mouth every 4 (four) hours as needed for moderate pain. (Patient not taking: Reported on 03/18/2018) 30 tablet 0  . traMADol (ULTRAM) 50 MG tablet Take 1 tablet (50 mg total) by mouth every 6 (six) hours as needed for severe pain. (Patient  not taking: Reported on 03/18/2018) 20 tablet 0   Past Medical History:  Diagnosis Date  . Arthritis   . Hypertension    no meds  . Peripheral vascular disease (HCC)    pad   Past Surgical History:  Procedure Laterality Date  . AORTA - BILATERAL FEMORAL ARTERY BYPASS GRAFT Bilateral 11/13/2017   Procedure: AORTA BIFEMORAL BYPASS GRAFT;  Surgeon: Annice Needyew, Jason S, MD;  Location: [facility] ORS;  Service:  Vascular;  Laterality: Bilateral;  . INSERTION OF ILIAC STENT Right 11/13/2017   Procedure: INSERTION OF ILIAC STENT (SFA STENT PLACEMENT );  Surgeon: Annice Needyew, Jason S, MD;  Location: [facility] ORS;  Service: Vascular;  Laterality: Right;  . LOWER EXTREMITY ANGIOGRAPHY Right 10/14/2017   Procedure: LOWER EXTREMITY ANGIOGRAPHY;  Surgeon: Annice Needyew, Jason S, MD;  Location: [facility] INVASIVE CV LAB;  Service: Cardiovascular;  Laterality: Right;  . MOUTH SURGERY     Social History Social History   Tobacco Use  . Smoking status: Current Every Day Smoker  . Smokeless tobacco: Never Used  Substance Use Topics  . Alcohol use: Yes    Comment: 3/4 BEERS PER DAY  . Drug use: Yes    Types: Marijuana   Family History Family History  Problem Relation Age of Onset  . Heart attack Father   Denies any family history of peripheral artery disease, venous disease or renal disease.  No Known Allergies   REVIEW OF SYSTEMS (Negative unless checked)  Constitutional: [] Weight loss  [] Fever  [] Chills Cardiac: [] Chest pain   [] Chest pressure   [] Palpitations   [] Shortness of breath when laying flat   [] Shortness of breath at rest   [] Shortness of breath with exertion. Vascular:  [x] Pain in legs with walking   [x] Pain in legs at rest   [x] Pain in legs when laying flat   [x] Claudication   [x] Pain in feet when walking  [x] Pain in feet at rest  [x] Pain in feet when laying flat   [] History of DVT   [] Phlebitis   [] Swelling in legs   [] Varicose veins   [] Non-healing ulcers Pulmonary:   [] Uses home oxygen   [] Productive cough   [] Hemoptysis   [] Wheeze  [] COPD   [] Asthma Neurologic:  [] Dizziness  [] Blackouts   [] Seizures   [] History of stroke   [] History of TIA  [] Aphasia   [] Temporary blindness   [] Dysphagia   [] Weakness or numbness in arms   [] Weakness or numbness in legs Musculoskeletal:  [] Arthritis   [] Joint swelling   [] Joint pain   [] Low back pain Hematologic:  [] Easy bruising  [] Easy bleeding   [] Hypercoagulable state    [] Anemic  [] Hepatitis Gastrointestinal:  [] Blood in stool   [] Vomiting blood  [] Gastroesophageal reflux/heartburn   [] Difficulty swallowing. Genitourinary:  [] Chronic kidney disease   [] Difficult urination  [] Frequent urination  [] Burning with urination   [] Blood in urine Skin:  [] Rashes   [] Ulcers   [] Wounds Psychological:  [] History of anxiety   []  History of major depression.  Physical Examination  Vitals:   03/18/18 1500 03/18/18 1519 03/18/18 1600 03/18/18 1635  BP: (!) 198/110 (!) 190/105 (!) 196/101 (!) 179/101  Pulse: (!) 123 (!) 119 (!) 118 (!) 128  Resp:  (!) 21 17 20   Temp:  98.6 F (37 C)    TempSrc:  Oral    SpO2: 98% 99% 98% 98%  Weight:      Height:       Body mass index is 18.79 kg/m. Gen:  WD/WN, NAD Head: Coats/AT, No temporalis wasting.  Prominent temp pulse not noted. Ear/Nose/Throat: Hearing grossly intact, nares w/o erythema or drainage, oropharynx w/o Erythema/Exudate Eyes: Sclera non-icteric, conjunctiva clear Neck: Trachea midline.  No JVD.  Pulmonary:  Good air movement, respirations not labored, equal bilaterally.  Cardiac: RRR, normal S1, S2. Vascular:  Vessel Right Left  Radial Palpable Palpable  Ulnar Palpable Palpable  Brachial Palpable Palpable  Carotid Palpable, without bruit Palpable, without bruit  Aorta Not palpable N/A  Femoral Palpable Palpable  Popliteal Non-Palpable Non-Palpable  PT Non-Palpable Non-Palpable  DP Non-Palpable Non-Palpable   Bilateral lower extremities: Thighs soft.  Calves soft.  Extremities are cool from approximately the knee distally.  Motor/sensory is intact bilaterally.  Skin is intact bilaterally.  Gastrointestinal: soft, non-tender/non-distended. No guarding/reflex.  Musculoskeletal: M/S 5/5 throughout. No deformity or atrophy. No edema. Neurologic: Sensation grossly intact in extremities.  Symmetrical.  Speech is fluent. Motor exam as listed above. Psychiatric: Judgment intact, Mood & affect appropriate for pt's  clinical situation. Dermatologic: No rashes or ulcers noted.  No cellulitis or open wounds. Lymph : No Cervical, Axillary, or Inguinal lymphadenopathy.  CBC Lab Results  Component Value Date   WBC 13.4 (H) 03/18/2018   HGB 15.9 03/18/2018   HCT 46.3 03/18/2018   MCV 100.7 (H) 03/18/2018   PLT 215 03/18/2018   BMET    Component Value Date/Time   NA 137 03/18/2018 1421   K 3.6 03/18/2018 1421   CL 104 03/18/2018 1421   CO2 22 03/18/2018 1421   GLUCOSE 198 (H) 03/18/2018 1421   BUN 7 03/18/2018 1421   CREATININE 0.72 03/18/2018 1421   CALCIUM 9.6 03/18/2018 1421   GFRNONAA >60 03/18/2018 1421   GFRAA >60 03/18/2018 1421   Estimated Creatinine Clearance: 79.3 mL/min (by C-G formula based on SCr of 0.72 mg/dL).  COAG Lab Results  Component Value Date   INR 0.96 03/18/2018   INR 1.02 11/13/2017   INR 0.83 11/06/2017   Radiology Ct Angio Aortobifemoral W And/or Wo Contrast  Result Date: 03/18/2018 CLINICAL DATA:  58 year old with history of an aortobifemoral bypass on 11/13/2017. Patient presents with bilateral leg pain and numbness. EXAM: CT ANGIOGRAPHY OF ABDOMINAL AORTA WITH ILIOFEMORAL RUNOFF TECHNIQUE: Multidetector CT imaging of the abdomen, pelvis and lower extremities was performed using the standard protocol during bolus administration of intravenous contrast. Multiplanar CT image reconstructions and MIPs were obtained to evaluate the vascular anatomy. CONTRAST:  100mL OMNIPAQUE IOHEXOL 350 MG/ML SOLN COMPARISON:  None. FINDINGS: VASCULAR Aorta: Occlusion of the infrarenal abdominal aorta. The aortobifemoral bypass is occluded bilaterally. Native distal abdominal aorta is occluded. Celiac: Mild atherosclerotic disease in the proximal celiac trunk without significant stenosis. Left gastric artery, splenic artery and common hepatic artery are patent. SMA: Atherosclerotic disease in the SMA. SMA is patent. No significant stenosis at the origin. Renals: Moderate stenosis in the  proximal right renal artery. Right renal artery is patent. At least mild stenosis in the proximal left renal artery. IMA: Reconstitution of the IMA branches.  Origin of IMA is occluded. RIGHT Lower Extremity Inflow: Native right external and common iliac arteries are patent. Right femoral bypass graft is occluded. Small amount of flow in internal iliac artery branches but difficult to evaluate due to calcifications. Outflow: Reconstitution of the right common femoral artery through collateral flow. Stenosis and possible thrombus in the right common femoral arter.y right profunda femoral arteries are patent. Proximal and mid right SFA is occluded. Reconstitution of the very distal SFA. Popliteal artery is patent. Runoff: Tibioperoneal trunk is heavily  calcified. Disease and stenosis at the origin of the anterior tibial artery. Peroneal artery and anterior tibial artery occluded in the mid/distal calf region. Single-vessel runoff from posterior tibial artery. LEFT Lower Extremity Inflow: Native left common and external iliac arteries are occluded. Limited evaluation of the left internal iliac artery branches. Left femoral bypass graft is occluded. Outflow: Minimal reconstitution of the distal left common femoral artery. There is flow in the left deep femoral arteries. Left SFA is occluded. Reconstitution of the mid popliteal artery above the knee. Distal popliteal artery is heavily calcified and diseased. Runoff: Calcifications and disease in the proximal runoff vessels. A small amount of the flow in the proximal runoff vessels. No significant runoff flow in the mid or distal calf. Veins: No obvious venous abnormality within the limitations of this arterial phase study. Review of the MIP images confirms the above findings. NON-VASCULAR Lower chest: Emphysematous changes at the lung bases. No pleural effusions. Several small pulmonary nodules at the lung bases. Largest measures 5 mm in the left lower lobe on sequence  4, image 8. Most of nodules appear to be stable 05/17/2008. There is a questionable nodular density in the lingula on sequence 4, image 6 that was probably present on the study from 10/14/2017. Hepatobiliary: No acute abnormality to the liver or gallbladder. Pancreas: Unremarkable. No pancreatic ductal dilatation or surrounding inflammatory changes. Spleen: Normal in size without focal abnormality. Adrenals/Urinary Tract: Normal adrenal glands. Fluid in the urinary bladder. Normal appearance of both kidneys. No hydronephrosis. No suspicious renal lesions. Stomach/Bowel: Stomach is within normal limits. Appendix appears normal. No evidence of bowel wall thickening, distention, or inflammatory changes. Lymphatic: No significant lymph node enlargement in the abdomen or pelvis. Reproductive: Prostate is unremarkable. Other: Negative for free fluid.  Negative for free air. Musculoskeletal: No acute bone abnormality. Subtle lucency along the left iliac wing is chronic. IMPRESSION: VASCULAR 1. 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 2. Reconstitution of the deep femoral arteries bilaterally. Flow in the distal right SFA and right popliteal artery. Reconstitution of the mid left popliteal artery. 3. Severe left runoff disease. No significant flow below the mid left calf. Right runoff disease with single-vessel runoff to the right ankle from the right posterior tibial artery. 4. Bilateral renal artery stenosis. NON-VASCULAR 1. Most of the small pulmonary nodules at the lung bases have not changed since 2010 but there is a questionable indeterminate small nodule in lingula. Non-contrast chest CT can be considered in 12 months based on underlying emphysema. This recommendation follows the consensus statement: Guidelines for Management of Incidental Pulmonary Nodules Detected on CT Images: From the Fleischner Society 2017; Radiology 2017; 284:228-243. These results were  called by telephone at the time of interpretation on 03/18/2018 at 4:01 pm to Dr. Alphonzo Lemmings, who verbally acknowledged these results. Electronically Signed   By: Richarda Overlie M.D.   On: 03/18/2018 16:31   Assessment/Plan The patient is a 58 year old male with a past medical history of hypertension, arthritis, active tobacco use and peripheral artery disease.  The patient is status post an aortobifemoral bypass on 11/13/17.  The patient presents to the [facility] emergency department today with 2 days of progressively worsening bilateral lower extremity pain found to have an occluded aorto bifemoral bypass on CTA 1. Occulusion of the Bifemoral Bypass: The patient presented to the [facility] emergency department with 2 days of progressively worsening bilateral lower extremity pain.  Describes the pain as an  acute and abrupt onset.  CTA with occlusion of the infrarenal aorta bifemoral bypass.  This is clearly a bilateral limb threatening situation.  Recommend admitting the patient starting a heparin drip.  We will plan on an aortogram in the a.m. with intervention and attempt to revascularize the bilateral lower extremities.  Procedure, risks and benefits explained to the patient.  All questions answered.  The patient wishes to proceed.  We will plan on this in the morning with Dr. Wyn Quaker. 2. Tobacco: We had a discussion for approximately three minutes regarding the absolute need for smoking cessation due to the deleterious nature of tobacco on the vascular system. We discussed the tobacco use would diminish patency of any intervention, and likely significantly worsen progressio of disease. We discussed multiple agents for quitting including replacement therapy or medications to reduce cravings such as Chantix. The patient voices their understanding of the importance of smoking cessation. 3. Hypertension: Encouraged good control as its slows the progression of  atherosclerotic disease  Discussed with Dr. Weldon Inches, PA-C  03/18/2018 5:11 PM  This note was created with Dragon medical transcription system.  Any error is purely unintentional.

## 2018-03-18 NOTE — ED Provider Notes (Signed)
[facility] Emergency Department Provider Note  ____________________________________________  Time seen: Approximately 2:52 PM  I have reviewed the triage vital signs and the nursing notes.   HISTORY  Chief Complaint Leg Pain    HPI AMEIR Kyle Gordon is a 58 y.o. male with a history of hypertension, peripheral vascular disease status past aortobifem bypass graft from November 13, 2017.   Patient reports he was in his usual state of health, compliant with his medications, and 2 days ago after lifting his wife he had sudden onset of bilateral leg pain radiating down to the feet associated with numbness.  Pain is severe, 9/10, no aggravating or alleviating factors.  Denies any chest pain shortness of breath fevers or chills.  Not on anticoagulants.  Takes a baby aspirin daily.     Past Medical History:  Diagnosis Date  . Arthritis   . Hypertension    no meds  . Peripheral vascular disease (HCC)    pad     Patient Active Problem List   Diagnosis Date Noted  . Surgical site infection 11/28/2017  . Atherosclerosis of artery of extremity with ulceration (HCC) 11/13/2017  . Elevated BP without diagnosis of hypertension 10/29/2017  . Atherosclerosis of native arteries of the extremities with ulceration (HCC) 10/29/2017  . Bilateral lower extremity pain 10/08/2016  . Claudication of both lower extremities (HCC) 10/08/2016  . Tobacco use disorder 10/08/2016     Past Surgical History:  Procedure Laterality Date  . AORTA - BILATERAL FEMORAL ARTERY BYPASS GRAFT Bilateral 11/13/2017   Procedure: AORTA BIFEMORAL BYPASS GRAFT;  Surgeon: Annice Needy, MD;  Location: [facility] ORS;  Service: Vascular;  Laterality: Bilateral;  . INSERTION OF ILIAC STENT Right 11/13/2017   Procedure: INSERTION OF ILIAC STENT (SFA STENT PLACEMENT );  Surgeon: Annice Needy, MD;  Location: [facility] ORS;  Service: Vascular;  Laterality: Right;  . LOWER EXTREMITY ANGIOGRAPHY Right 10/14/2017   Procedure: LOWER EXTREMITY ANGIOGRAPHY;  Surgeon: Annice Needy, MD;  Location: [facility] INVASIVE CV LAB;  Service: Cardiovascular;  Laterality: Right;  . MOUTH SURGERY       Prior to Admission medications   Medication Sig Start Date End Date Taking? Authorizing Provider  amLODipine (NORVASC) 5 MG tablet Take 1 tablet (5 mg total) by mouth daily. 11/19/17   Annice Needy, MD  aspirin EC 81 MG EC tablet Take 1 tablet (81 mg total) by mouth daily. 11/19/17   Annice Needy, MD  atorvastatin (LIPITOR) 10 MG tablet Take 1 tablet (10 mg total) by mouth daily. 11/18/17 11/18/18  Annice Needy, MD  celecoxib (CELEBREX) 100 MG capsule Take 100 mg by mouth 2 (two) times daily. As needed    [provider]  cephALEXin (KEFLEX) 500 MG capsule Take 1 capsule (500 mg total) by mouth 2 (two) times daily. 11/28/17   Georgiana Spinner, NP  clopidogrel (PLAVIX) 75 MG tablet Take 1 tablet (75 mg total) by mouth daily. 11/19/17   Annice Needy, MD  docusate sodium (COLACE) 100 MG capsule Take 1 capsule (100 mg total) by mouth daily. 11/19/17   Annice Needy, MD  Ibuprofen (ADVIL PO) Take by mouth as needed.    [provider]  labetalol (NORMODYNE) 100 MG tablet Take 1 tablet (100 mg total) by mouth 2 (two) times daily. 11/18/17   Annice Needy, MD  oxyCODONE-acetaminophen (PERCOCET/ROXICET) 5-325 MG tablet Take 1-2 tablets by mouth every 4 (four) hours as needed for moderate pain. 11/28/17  Georgiana Spinner, NP  traMADol (ULTRAM) 50 MG tablet Take 1 tablet (50 mg total) by mouth every 6 (six) hours as needed for severe pain. 10/01/17   Georgiana Spinner, NP     Allergies Patient has no known allergies.   Family History  Problem Relation Age of Onset  . Heart attack Father     Social History Social History   Tobacco Use  . Smoking status: Current Every Day Smoker  . Smokeless tobacco: Never Used  Substance Use Topics  . Alcohol use: Yes    Comment: 3/4 BEERS PER DAY  . Drug use: Yes    Types:  Marijuana    Review of Systems  Constitutional:   No fever or chills.  ENT:   No sore throat. No rhinorrhea. Cardiovascular:   No chest pain or syncope. Respiratory:   No dyspnea or cough. Gastrointestinal:   Negative for abdominal pain, vomiting and diarrhea.  Musculoskeletal: Bilateral leg pain All other systems reviewed and are negative except as documented above in ROS and HPI.  ____________________________________________   PHYSICAL EXAM:  VITAL SIGNS: ED Triage Vitals  Enc Vitals Group     BP 03/18/18 1412 (!) 195/116     Pulse Rate 03/18/18 1412 (!) 140     Resp 03/18/18 1412 18     Temp 03/18/18 1412 97.8 F (36.6 C)     Temp Source 03/18/18 1412 Oral     SpO2 03/18/18 1412 100 %     Weight 03/18/18 1413 120 lb (54.4 kg)     Height 03/18/18 1413 5\' 7"  (1.702 m)     Head Circumference --      Peak Flow --      Pain Score 03/18/18 1412 10     Pain Loc --      Pain Edu? --      Excl. in GC? --     Vital signs reviewed, nursing assessments reviewed.   Constitutional:   Alert and oriented. Non-toxic appearance. Eyes:   Conjunctivae are normal. EOMI. PERRL. ENT      Head:   Normocephalic and atraumatic.      Nose:   No congestion/rhinnorhea.       Mouth/Throat:   MMM, no pharyngeal erythema. No peritonsillar mass.       Neck:   No meningismus. Full ROM. Hematological/Lymphatic/Immunilogical:   No cervical lymphadenopathy. Cardiovascular:   RRR. Symmetric bilateral radial  pulses.  No murmurs.  Bilateral feet are cold. Able to Doppler a gurgling pulse at the right femoral anastomosis.  No dopplerable pulses distally in the right leg or throughout the entire left leg. Respiratory:   Normal respiratory effort without tachypnea/retractions. Breath sounds are clear and equal bilaterally. No wheezes/rales/rhonchi. Gastrointestinal:   Soft and nontender. Non distended. There is no CVA tenderness.  No rebound, rigidity, or guarding. Musculoskeletal:   Normal range of  motion in all extremities. No joint effusions.  No lower extremity tenderness.  No edema. Neurologic:   Normal speech and language.  Motor grossly intact. No acute focal neurologic deficits are appreciated.  Skin:    Skin is warm, dry and intact. No rash noted.  No petechiae, purpura, or bullae.  ____________________________________________    LABS (pertinent positives/negatives) (all labs ordered are listed, but only abnormal results are displayed) Labs Reviewed  COMPREHENSIVE METABOLIC PANEL - Abnormal; Notable for the following components:      Result Value   Glucose, Bld 198 (*)    Total Protein 8.4 (*)  All other components within normal limits  CBC WITH DIFFERENTIAL/PLATELET - Abnormal; Notable for the following components:   WBC 13.4 (*)    MCV 100.7 (*)    MCH 34.6 (*)    Neutro Abs 11.0 (*)    All other components within normal limits  PROTIME-INR  APTT  HEPARIN LEVEL (UNFRACTIONATED)   ____________________________________________   EKG  Interpreted by me Sinus tachycardia rate 135, normal axis.  Prolonged QTC of 503 ms.  Poor R wave progression.  Normal ST segments and T waves.  Consistent with LVH.  No acute ischemic changes  ____________________________________________    RADIOLOGY  No results found.  ____________________________________________   PROCEDURES .Critical Care Performed by: Sharman CheekStafford, Carleena Mires, MD Authorized by: Sharman CheekStafford, Aquarius Latouche, MD   Critical care provider statement:    Critical care time (minutes):  35   Critical care time was exclusive of:  Separately billable procedures and treating other patients   Critical care was necessary to treat or prevent imminent or life-threatening deterioration of the following conditions:  Circulatory failure   Critical care was time spent personally by me on the following activities:  Development of treatment plan with patient or surrogate, discussions with consultants, evaluation of patient's response  to treatment, examination of patient, obtaining history from patient or surrogate, ordering and performing treatments and interventions, ordering and review of laboratory studies, ordering and review of radiographic studies, pulse oximetry, re-evaluation of patient's condition and review of old charts    ____________________________________________  DIFFERENTIAL DIAGNOSIS   Aortic graft thrombosis, dehydration, aortic dissection  CLINICAL IMPRESSION / ASSESSMENT AND PLAN / ED COURSE  Pertinent labs & imaging results that were available during my care of the patient were reviewed by me and considered in my medical decision making (see chart for details).    Patient presents with severe hypertension, severe acute bilateral leg pain and numbness, concerning for thrombosis of his aortic bypass graft.  Plan for CT angiogram.  Will give Dilaudid 1 mg IV for pain control.  Labetalol for now for blood pressure management.  IV fluids for hydration.  Clinical Course as of Mar 18 1453  Tue Mar 18, 2018  1455 D/w Dr. Wyn Quakerew, agrees with CTA, start heparin, plan to admit to hospitalist.    [PS]    Clinical Course User Index [PS] Sharman CheekStafford, Azlin Zilberman, MD     ____________________________________________   FINAL CLINICAL IMPRESSION(S) / ED DIAGNOSES    Final diagnoses:  Bilateral leg pain  Thrombosis of aortic bifurcation bypass graft, initial encounter [facility](HCC)     ED Discharge Orders    None      Portions of this note were generated with dragon dictation software. Dictation errors may occur despite best attempts at proofreading.   Sharman CheekStafford, Darrold Bezek, MD 03/18/18 [phone]

## 2018-03-18 NOTE — ED Notes (Signed)
Patient c/o of pain to b/l lower ext pain 8/10/ PRN meds given for pain control. Will re-assess.

## 2018-03-18 NOTE — H&P (Signed)
[employer] - [place] at [facility]   PATIENT NAME: Kyle Gordon    MR#:  [id number]  DATE OF BIRTH:  [birth year]  DATE OF ADMISSION:  03/18/2018  PRIMARY CARE PHYSICIAN: Patient, No Pcp Per   REQUESTING/REFERRING PHYSICIAN:   CHIEF COMPLAINT:   Chief Complaint  Patient presents with  . Leg Pain    HISTORY OF PRESENT ILLNESS: Kyle Gordon  is a 58 y.o. male with a known history history per below which includes peripheral arterial disease status post aortobifem bypass, presenting with 2-day history of bilateral leg pain, numbness, tingling, continues to worsen, no better with medication, worse with movement, in the emergency room patient was noted to be tachycardic, hypertensive, glucose 198, white count 13.4, CT abdomen noted for infrarenal abdominal aortic occlusion, vascular surgery/Dr. dew recommended patient be admitted/made n.p.o. after midnight to the hospitalist service, patient valuated in the emergency room, in mild distress due to pain, malnourished/cachectic appearing, admits to continued smoking and alcohol use with history of DUI, patient is now been negative for acute aortic occlusion.  PAST MEDICAL HISTORY:   Past Medical History:  Diagnosis Date  . Arthritis   . Hypertension    no meds  . Peripheral vascular disease (HCC)    pad    PAST SURGICAL HISTORY:  Past Surgical History:  Procedure Laterality Date  . AORTA - BILATERAL FEMORAL ARTERY BYPASS GRAFT Bilateral 11/13/2017   Procedure: AORTA BIFEMORAL BYPASS GRAFT;  Surgeon: Annice Needy, MD;  Location: [facility] ORS;  Service: Vascular;  Laterality: Bilateral;  . INSERTION OF ILIAC STENT Right 11/13/2017   Procedure: INSERTION OF ILIAC STENT (SFA STENT PLACEMENT );  Surgeon: Annice Needy, MD;  Location: [facility] ORS;  Service: Vascular;  Laterality: Right;  . LOWER EXTREMITY ANGIOGRAPHY Right 10/14/2017   Procedure: LOWER EXTREMITY ANGIOGRAPHY;  Surgeon: Annice Needy, MD;  Location: [facility] INVASIVE CV  LAB;  Service: Cardiovascular;  Laterality: Right;  . MOUTH SURGERY      SOCIAL HISTORY:  Social History   Tobacco Use  . Smoking status: Current Every Day Smoker  . Smokeless tobacco: Never Used  Substance Use Topics  . Alcohol use: Yes    Comment: 3/4 BEERS PER DAY    FAMILY HISTORY:  Family History  Problem Relation Age of Onset  . Heart attack Father     DRUG ALLERGIES: No Known Allergies  REVIEW OF SYSTEMS:   CONSTITUTIONAL: No fever, fatigue or weakness.  EYES: No blurred or double vision.  EARS, NOSE, AND THROAT: No tinnitus or ear pain.  RESPIRATORY: No cough, shortness of breath, wheezing or hemoptysis.  CARDIOVASCULAR: No chest pain, orthopnea, edema.  GASTROINTESTINAL: No nausea, vomiting, diarrhea or abdominal pain.  GENITOURINARY: No dysuria, hematuria.  ENDOCRINE: No polyuria, nocturia,  HEMATOLOGY: No anemia, easy bruising or bleeding SKIN: No rash or lesion. MUSCULOSKELETAL: Bilateral leg pain NEUROLOGIC: No tingling, numbness, weakness.  PSYCHIATRY: No anxiety or depression.   MEDICATIONS AT HOME:  Prior to Admission medications   Medication Sig Start Date End Date Taking? Authorizing Provider  amLODipine (NORVASC) 5 MG tablet Take 1 tablet (5 mg total) by mouth daily. 11/19/17   Annice Needy, MD  aspirin EC 81 MG EC tablet Take 1 tablet (81 mg total) by mouth daily. 11/19/17   Annice Needy, MD  atorvastatin (LIPITOR) 10 MG tablet Take 1 tablet (10 mg total) by mouth daily. 11/18/17 11/18/18  Annice Needy, MD  celecoxib (CELEBREX) 100 MG capsule Take 100 mg by  mouth 2 (two) times daily. As needed    [provider]  cephALEXin (KEFLEX) 500 MG capsule Take 1 capsule (500 mg total) by mouth 2 (two) times daily. 11/28/17   Georgiana Spinner, NP  clopidogrel (PLAVIX) 75 MG tablet Take 1 tablet (75 mg total) by mouth daily. 11/19/17   Annice Needy, MD  docusate sodium (COLACE) 100 MG capsule Take 1 capsule (100 mg total) by mouth daily. 11/19/17    Annice Needy, MD  Ibuprofen (ADVIL PO) Take by mouth as needed.    [provider]  labetalol (NORMODYNE) 100 MG tablet Take 1 tablet (100 mg total) by mouth 2 (two) times daily. 11/18/17   Annice Needy, MD  oxyCODONE-acetaminophen (PERCOCET/ROXICET) 5-325 MG tablet Take 1-2 tablets by mouth every 4 (four) hours as needed for moderate pain. 11/28/17   Georgiana Spinner, NP  traMADol (ULTRAM) 50 MG tablet Take 1 tablet (50 mg total) by mouth every 6 (six) hours as needed for severe pain. 10/01/17   Georgiana Spinner, NP      PHYSICAL EXAMINATION:   VITAL SIGNS: Blood pressure (!) 190/105, pulse (!) 119, temperature 98.6 F (37 C), temperature source Oral, resp. rate (!) 21, height 5\' 7"  (1.702 m), weight 54.4 kg, SpO2 99 %.  GENERAL:  58 y.o.-year-old patient lying in the bed with no acute distress.  Malnourished appearance EYES: Pupils equal, round, reactive to light and accommodation. No scleral icterus. Extraocular muscles intact.  HEENT: Head atraumatic, normocephalic. Oropharynx and nasopharynx clear.  NECK:  Supple, no jugular venous distention. No thyroid enlargement, no tenderness.  LUNGS: Normal breath sounds bilaterally, no wheezing, rales,rhonchi or crepitation. No use of accessory muscles of respiration.  CARDIOVASCULAR: S1, S2 normal. No murmurs, rubs, or gallops.  Nonpalpable pulses at ankle/foot ABDOMEN: Soft, nontender, nondistended. Bowel sounds present. No organomegaly or mass.  EXTREMITIES: No pedal edema, cyanosis, or clubbing.  Diffuse bilateral muscular atrophy nEUROLOGIC: Cranial nerves II through XII are intact. Muscle strength 5/5 in all extremities. Sensation intact. Gait not checked.  PSYCHIATRIC: The patient is alert and oriented x 3.  SKIN: Bilateral legs cool to touch  LABORATORY PANEL:   CBC Recent Labs  Lab 03/18/18 1421  WBC 13.4*  HGB 15.9  HCT 46.3  PLT 215  MCV 100.7*  MCH 34.6*  MCHC 34.3  RDW 13.6  LYMPHSABS 1.7  MONOABS 0.7  EOSABS 0.0   BASOSABS 0.0   ------------------------------------------------------------------------------------------------------------------  Chemistries  Recent Labs  Lab 03/18/18 1421  NA 137  K 3.6  CL 104  CO2 22  GLUCOSE 198*  BUN 7  CREATININE 0.72  CALCIUM 9.6  AST 30  ALT 16  ALKPHOS 99  BILITOT 1.1   ------------------------------------------------------------------------------------------------------------------ estimated creatinine clearance is 79.3 mL/min (by C-G formula based on SCr of 0.72 mg/dL). ------------------------------------------------------------------------------------------------------------------ No results for input(s): TSH, T4TOTAL, T3FREE, THYROIDAB in the last 72 hours.  Invalid input(s): FREET3   Coagulation profile Recent Labs  Lab 03/18/18 1421  INR 0.96   ------------------------------------------------------------------------------------------------------------------- No results for input(s): DDIMER in the last 72 hours. -------------------------------------------------------------------------------------------------------------------  Cardiac Enzymes No results for input(s): CKMB, TROPONINI, MYOGLOBIN in the last 168 hours.  Invalid input(s): CK ------------------------------------------------------------------------------------------------------------------ Invalid input(s): POCBNP  ---------------------------------------------------------------------------------------------------------------  Urinalysis No results found for: COLORURINE, APPEARANCEUR, LABSPEC, PHURINE, GLUCOSEU, HGBUR, BILIRUBINUR, KETONESUR, PROTEINUR, UROBILINOGEN, NITRITE, LEUKOCYTESUR   RADIOLOGY: No results found.  EKG: Orders placed or performed during the hospital encounter of 03/18/18  . EKG 12-Lead  . EKG 12-Lead  IMPRESSION AND PLAN: *Acute aortic occlusion Noted history of aortobifem bypass, continued smoking, alcohol abuse with history of  DUI Admit to regular nursing floor bed, n.p.o. after midnight per vascular surgery/Dr. dew, continue aspirin/Plavix, start heparin drip, increase statin therapy-check lipids in the morning, adult pain protocol, and continue close medical monitoring  *Acute on chronic tobacco smoking abuse/dependency Nicotine patch and cessation counseling ordered  *History of alcohol abuse with DUI Alcohol withdrawal protocol while in house  *Chronic deconditioning/malnourished appearance Dietary consulted  *Acute sinus tachycardia Most likely secondary to pain Adult pain protocol, IV fluids for rehydration, continue beta-blocker therapy, IV Lopressor PRN   *Acute accelerated hypertension  Exacerbated by pain  continue Norvasc, labetalol, PRN Lopressor IV with hold parameters, vitals per routine, make changes as per necessary  All the records are reviewed and case discussed with ED provider. Management plans discussed with the patient, family and they are in agreement.  CODE STATUS:full Code Status History    Date Active Date Inactive Code Status Order ID Comments User Context   11/13/2017 1533 11/18/2017 1819 Full Code [id number]  Annice Needyew, Jason S, MD Inpatient   10/14/2017 0930 10/16/2017 0310 Full Code [id number]  Annice Needyew, Jason S, MD Inpatient       TOTAL TIME TAKING CARE OF THIS PATIENT: 40 minutes.    Evelena AsaMontell D Oluwadamilola Rosamond M.D on 03/18/2018   Between 7am to 6pm - Pager - [phone]  After 6pm go to [email or web address] - password [email or web address]  [facility]  Office  [phone]  CC: Primary care physician; Patient, No Pcp Per   Note: This dictation was prepared with Dragon dictation along with smaller phrase technology. Any transcriptional errors that result from this process are unintentional.

## 2018-03-19 ENCOUNTER — Other Ambulatory Visit: Payer: Self-pay

## 2018-03-19 ENCOUNTER — Encounter
Admission: EM | Disposition: A | Discharge status: Home / Self Care | Payer: Self-pay | Source: Home / Self Care | Attending: Family Medicine

## 2018-03-19 DIAGNOSIS — I70223 Atherosclerosis of native arteries of extremities with rest pain, bilateral legs: Secondary | ICD-10-CM

## 2018-03-19 DIAGNOSIS — I700 Atherosclerosis of aorta: Secondary | ICD-10-CM

## 2018-03-19 DIAGNOSIS — I7025 Atherosclerosis of native arteries of other extremities with ulceration: Secondary | ICD-10-CM

## 2018-03-19 DIAGNOSIS — T82868A Thrombosis of vascular prosthetic devices, implants and grafts, initial encounter: Secondary | ICD-10-CM

## 2018-03-19 HISTORY — PX: LOWER EXTREMITY ANGIOGRAPHY: CATH118251

## 2018-03-19 LAB — BASIC METABOLIC PANEL
Anion gap: 7 (ref 5–15)
BUN: 5 mg/dL — ABNORMAL LOW (ref 6–20)
CALCIUM: 9 mg/dL (ref 8.9–10.3)
CO2: 25 mmol/L (ref 22–32)
Chloride: 103 mmol/L (ref 98–111)
Creatinine, Ser: 0.57 mg/dL — ABNORMAL LOW (ref 0.61–1.24)
GFR calc Af Amer: >60 mL/min (ref >60)
GFR calc non Af Amer: >60 mL/min (ref >60)
Glucose, Bld: 132 mg/dL — ABNORMAL HIGH (ref 70–99)
Potassium: 3.7 mmol/L (ref 3.5–5.1)
Sodium: 135 mmol/L (ref 135–145)

## 2018-03-19 LAB — CBC
HCT: 44.4 % (ref 39.0–52.0)
HCT: 42.2 % (ref 39.0–52.0)
Hemoglobin: 15.3 g/dL (ref 13.0–17.0)
Hemoglobin: 14.6 g/dL (ref 13.0–17.0)
MCH: 34.9 pg — ABNORMAL HIGH (ref 26.0–34.0)
MCH: 34.6 pg — ABNORMAL HIGH (ref 26.0–34.0)
MCHC: 34.5 g/dL (ref 30.0–36.0)
MCHC: 34.6 g/dL (ref 30.0–36.0)
MCV: 101.1 fL — ABNORMAL HIGH (ref 80.0–100.0)
MCV: 100 fL (ref 80.0–100.0)
Platelets: 192 10*3/uL (ref 150–400)
Platelets: 157 10*3/uL (ref 150–400)
RBC: 4.39 MIL/uL (ref 4.22–5.81)
RBC: 4.22 MIL/uL (ref 4.22–5.81)
RDW: 13.6 % (ref 11.5–15.5)
RDW: 13.3 % (ref 11.5–15.5)
WBC: 9.1 10*3/uL (ref 4.0–10.5)
WBC: 10.8 10*3/uL — ABNORMAL HIGH (ref 4.0–10.5)
nRBC: 0 % (ref 0.0–0.2)
nRBC: 0 % (ref 0.0–0.2)

## 2018-03-19 LAB — PROTIME-INR
INR: 0.91
Prothrombin Time: 12.2 seconds (ref 11.4–15.2)

## 2018-03-19 LAB — MRSA PCR SCREENING: MRSA by PCR: NEGATIVE

## 2018-03-19 LAB — TYPE AND SCREEN
ABO/RH(D): A POS
Antibody Screen: NEGATIVE

## 2018-03-19 LAB — HEPARIN LEVEL (UNFRACTIONATED)
Heparin Unfractionated: 1.23 IU/mL — ABNORMAL HIGH (ref 0.30–0.70)
Heparin Unfractionated: 0.53 IU/mL (ref 0.30–0.70)
Heparin Unfractionated: 0.33 IU/mL (ref 0.30–0.70)

## 2018-03-19 LAB — GLUCOSE, CAPILLARY: Glucose-Capillary: 103 mg/dL — ABNORMAL HIGH (ref 70–99)

## 2018-03-19 LAB — MAGNESIUM: Magnesium: 1.9 mg/dL (ref 1.7–2.4)

## 2018-03-19 LAB — FIBRINOGEN: Fibrinogen: 178 mg/dL — ABNORMAL LOW (ref 210–475)

## 2018-03-19 SURGERY — LOWER EXTREMITY ANGIOGRAPHY
Anesthesia: Moderate Sedation | Laterality: Bilateral

## 2018-03-19 MED ORDER — FENTANYL CITRATE (PF) 100 MCG/2ML IJ SOLN
INTRAMUSCULAR | Status: AC
  Filled 2018-03-19: qty 2

## 2018-03-19 MED ORDER — MIDAZOLAM HCL 2 MG/ML PO SYRP: 8.0000 mg | ORAL_SOLUTION | Freq: Once | ORAL | Status: DC | PRN

## 2018-03-19 MED ORDER — SODIUM CHLORIDE 0.9 % IV SOLN
0.5000 mg/h | INTRAVENOUS | Status: DC
  Administered 2018-03-20: 0.5 mg/h
  Filled 2018-03-19: qty 10

## 2018-03-19 MED ORDER — ALTEPLASE 2 MG IJ SOLR: 1.0000 mg | Freq: Once | INTRAMUSCULAR | Status: DC

## 2018-03-19 MED ORDER — ORAL CARE MOUTH RINSE: 15.0000 mL | Freq: Two times a day (BID) | OROMUCOSAL | Status: DC

## 2018-03-19 MED ORDER — ONDANSETRON HCL 4 MG/2ML IJ SOLN: 4.0000 mg | Freq: Four times a day (QID) | INTRAMUSCULAR | Status: DC | PRN

## 2018-03-19 MED ORDER — LABETALOL HCL 5 MG/ML IV SOLN
INTRAVENOUS | Status: AC
  Administered 2018-03-19: 10 mg via INTRAVENOUS
  Filled 2018-03-19: qty 4

## 2018-03-19 MED ORDER — HEPARIN (PORCINE) 25000 UT/250ML-% IV SOLN
400.0000 [IU]/h | INTRAVENOUS | Status: DC
  Administered 2018-03-19: 400 [IU]/h via INTRAVENOUS

## 2018-03-19 MED ORDER — SODIUM CHLORIDE 0.9 % IV SOLN
INTRAVENOUS | Status: DC
  Administered 2018-03-20: 05:00:00 via INTRAVENOUS

## 2018-03-19 MED ORDER — MORPHINE SULFATE (PF) 4 MG/ML IV SOLN: 5.0000 mg | INTRAVENOUS | Status: DC | PRN

## 2018-03-19 MED ORDER — HYDROMORPHONE HCL 1 MG/ML IJ SOLN
1.0000 mg | Freq: Once | INTRAMUSCULAR | Status: AC | PRN
  Administered 2018-03-19: 1 mg via INTRAVENOUS

## 2018-03-19 MED ORDER — HEPARIN (PORCINE) IN NACL 1000-0.9 UT/500ML-% IV SOLN
INTRAVENOUS | Status: AC
  Filled 2018-03-19: qty 1000

## 2018-03-19 MED ORDER — MIDAZOLAM HCL 5 MG/5ML IJ SOLN
INTRAMUSCULAR | Status: AC
  Filled 2018-03-19: qty 5

## 2018-03-19 MED ORDER — DIPHENHYDRAMINE HCL 50 MG/ML IJ SOLN
INTRAMUSCULAR | Status: DC | PRN
  Administered 2018-03-19: 25 mg via INTRAVENOUS

## 2018-03-19 MED ORDER — MIDAZOLAM HCL 2 MG/2ML IJ SOLN: 1.0000 mg | INTRAMUSCULAR | Status: DC | PRN

## 2018-03-19 MED ORDER — DIPHENHYDRAMINE HCL 50 MG/ML IJ SOLN
INTRAMUSCULAR | Status: AC
  Filled 2018-03-19: qty 1

## 2018-03-19 MED ORDER — SODIUM CHLORIDE 0.9 % IV SOLN
0.5000 mg/h | INTRAVENOUS | Status: DC
  Administered 2018-03-20: 0.5 mg/h
  Filled 2018-03-19 (×2): qty 10

## 2018-03-19 MED ORDER — LABETALOL HCL 5 MG/ML IV SOLN
10.0000 mg | Freq: Once | INTRAVENOUS | Status: AC
  Administered 2018-03-19: 10 mg via INTRAVENOUS

## 2018-03-19 MED ORDER — CHLORHEXIDINE GLUCONATE 0.12 % MT SOLN
15.0000 mL | Freq: Two times a day (BID) | OROMUCOSAL | Status: DC
  Administered 2018-03-20 – 2018-03-22 (×4): 15 mL via OROMUCOSAL
  Filled 2018-03-19 (×3): qty 15

## 2018-03-19 MED ORDER — IOPAMIDOL (ISOVUE-300) INJECTION 61%
INTRAVENOUS | Status: DC | PRN
  Administered 2018-03-19: 25 mL via INTRA_ARTERIAL

## 2018-03-19 MED ORDER — ALTEPLASE 2 MG IJ SOLR
INTRAMUSCULAR | Status: AC
  Filled 2018-03-19: qty 8

## 2018-03-19 MED ORDER — HEPARIN SODIUM (PORCINE) 1000 UNIT/ML IJ SOLN
INTRAMUSCULAR | Status: AC
  Filled 2018-03-19: qty 1

## 2018-03-19 MED ORDER — METHYLPREDNISOLONE SODIUM SUCC 125 MG IJ SOLR: 125.0000 mg | INTRAMUSCULAR | Status: DC | PRN

## 2018-03-19 MED ORDER — MIDAZOLAM HCL 2 MG/2ML IJ SOLN
INTRAMUSCULAR | Status: DC | PRN
  Administered 2018-03-19: 1 mg via INTRAVENOUS
  Administered 2018-03-19: 2 mg via INTRAVENOUS

## 2018-03-19 MED ORDER — HEPARIN (PORCINE) 25000 UT/250ML-% IV SOLN: 800.0000 [IU]/h | INTRAVENOUS | Status: DC

## 2018-03-19 MED ORDER — SODIUM CHLORIDE 0.9 % IV SOLN
1.0000 mg/h | INTRAVENOUS | Status: AC
  Administered 2018-03-19: 1 mg/h
  Filled 2018-03-19: qty 10

## 2018-03-19 MED ORDER — HYDRALAZINE HCL 20 MG/ML IJ SOLN
INTRAMUSCULAR | Status: AC
  Filled 2018-03-19: qty 1

## 2018-03-19 MED ORDER — SODIUM CHLORIDE 0.9 % IV SOLN: 250.0000 mL | INTRAVENOUS | Status: DC | PRN

## 2018-03-19 MED ORDER — MORPHINE SULFATE (PF) 4 MG/ML IV SOLN
INTRAVENOUS | Status: AC
  Administered 2018-03-21: 3 mg via INTRAVENOUS
  Filled 2018-03-19: qty 1

## 2018-03-19 MED ORDER — SODIUM CHLORIDE 0.9% FLUSH
3.0000 mL | INTRAVENOUS | Status: DC | PRN
  Administered 2018-03-20: 3 mL via INTRAVENOUS
  Filled 2018-03-19: qty 3

## 2018-03-19 MED ORDER — FENTANYL CITRATE (PF) 100 MCG/2ML IJ SOLN
INTRAMUSCULAR | Status: DC | PRN
  Administered 2018-03-19 (×2): 50 ug via INTRAVENOUS

## 2018-03-19 MED ORDER — FAMOTIDINE 20 MG PO TABS: 40.0000 mg | ORAL_TABLET | ORAL | Status: DC | PRN

## 2018-03-19 MED ORDER — CEFAZOLIN SODIUM-DEXTROSE 2-4 GM/100ML-% IV SOLN
INTRAVENOUS | Status: AC
  Administered 2018-03-19: 12:00:00
  Filled 2018-03-19: qty 100

## 2018-03-19 MED ORDER — LIDOCAINE-EPINEPHRINE (PF) 1 %-1:200000 IJ SOLN
INTRAMUSCULAR | Status: AC
  Filled 2018-03-19: qty 30

## 2018-03-19 MED ORDER — HYDROMORPHONE HCL 1 MG/ML IJ SOLN
INTRAMUSCULAR | Status: AC
  Administered 2018-03-19: 1 mg via INTRAVENOUS
  Filled 2018-03-19: qty 1

## 2018-03-19 MED ORDER — DIPHENHYDRAMINE HCL 50 MG/ML IJ SOLN: 50.0000 mg | Freq: Once | INTRAMUSCULAR | Status: DC

## 2018-03-19 MED ORDER — SODIUM CHLORIDE 0.9 % IV SOLN
INTRAVENOUS | Status: DC
  Administered 2018-03-19: 11:00:00 via INTRAVENOUS

## 2018-03-19 MED ORDER — HYDRALAZINE HCL 20 MG/ML IJ SOLN
10.0000 mg | Freq: Four times a day (QID) | INTRAMUSCULAR | Status: DC | PRN
  Administered 2018-03-19: 10 mg via INTRAVENOUS

## 2018-03-19 MED ORDER — HEPARIN (PORCINE) 25000 UT/250ML-% IV SOLN
INTRAVENOUS | Status: AC
  Filled 2018-03-19: qty 250

## 2018-03-19 MED ORDER — ALTEPLASE 2 MG IJ SOLR
INTRAMUSCULAR | Status: DC | PRN
  Administered 2018-03-19: 4 mg

## 2018-03-19 MED ORDER — SODIUM CHLORIDE 0.9% FLUSH
3.0000 mL | Freq: Two times a day (BID) | INTRAVENOUS | Status: DC
  Administered 2018-03-19 – 2018-03-21 (×5): 3 mL via INTRAVENOUS

## 2018-03-19 MED ORDER — HEPARIN (PORCINE) 25000 UT/250ML-% IV SOLN
INTRAVENOUS | Status: AC
  Administered 2018-03-19: 400 [IU]/h via INTRAVENOUS
  Filled 2018-03-19: qty 250

## 2018-03-19 MED FILL — Aspirin Tab Delayed Release 81 MG: ORAL | Qty: 1 | Status: AC

## 2018-03-19 SURGICAL SUPPLY — 10 items
CANNULA 5F STIFF (CANNULA) ×3 IMPLANT
CATH INFUS 90CMX20CM (CATHETERS) ×6 IMPLANT
CATH PIG 70CM (CATHETERS) ×3 IMPLANT
GLIDEWIRE ADV .035X180CM (WIRE) ×3 IMPLANT
KIT CATH CVC 3 LUMEN 7FR 8IN (MISCELLANEOUS) ×3 IMPLANT
PACK ANGIOGRAPHY (CUSTOM PROCEDURE TRAY) ×3 IMPLANT
SHEATH BRITE TIP 6FRX11 (SHEATH) ×6 IMPLANT
SYR MEDRAD MARK V 150ML (SYRINGE) ×3 IMPLANT
TUBING CONTRAST HIGH PRESS 72 (TUBING) ×3 IMPLANT
WIRE MAGIC TORQUE 260C (WIRE) ×3 IMPLANT

## 2018-03-19 NOTE — ED Notes (Signed)
Hospitalist to bedside.

## 2018-03-19 NOTE — Consult Note (Signed)
Name: Kyle Gordon MRN: [id number] DOB: 1960/06/06    ADMISSION DATE:  03/18/2018 CONSULTATION DATE:  03/19/2018  REFERRING MD :  Dr. Wyn Quaker  CHIEF COMPLAINT:  Bilateral Lower Extremity Rest Pain  BRIEF PATIENT DESCRIPTION:  58 y.o. Male with a PMH of HTN, PAD, s/p aortobifemoral bypass in 10/2017, current smoker, and ETOH abuse who presented on 2/18 with complaints of bilateral LE rest pain.  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  On 2/19 he underwent Aortogram & Ileofemoral angiogram with catheter directed thrombolytic therapy.  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   2. Reconstitution of the deep femoral arteries bilaterally. Flow in the distal right SFA and right popliteal artery. Reconstitution of the mid left popliteal artery. 3. Severe left runoff disease. No significant flow below the mid left calf. Right runoff disease with single-vessel runoff to the right ankle from the right posterior tibial artery. 4. Bilateral renal artery stenosis.  NON-VASCULAR  1. Most of the small pulmonary nodules at the lung bases have not changed since 2010 but there is a questionable indeterminate small nodule in lingula. Non-contrast chest CT can be considered in 12 months based on underlying emphysema  CULTURES: MRSA PCR 03/19/18>>  ANTIBIOTICS: Cefazolin 2/19 x1 dose (surgical prophylaxis)  HISTORY OF PRESENT ILLNESS:   Kyle Gordon is a 58 year old male with a past medical history of hypertension, arthritis, peripheral artery disease status post  aortobifemoral bypass 11/13/2017, active tobacco use, and alcohol abuse who presented to [facility] ED on 03/18/2018 with complaints of bilateral lower extremity pain.  He reports approximately 2 days of progressively worsening bilateral lower extremity pain, described as constant both with and without activity.  Denies any ulcer formation to the lower extremities.  Upon presentation to the ED he was noted to be tachycardic and hypertensive.  Initial work-up in the ED revealed WBC 13.4.  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  Vascular surgery was consulted.  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  On 03/19/18 Vascular Surgery performed an aortogram and iliofemoral angiogram with catheter directed thrombolytic therapy.  He was noted to have normal renal arteries, occlusion of the aorta at the level of the renal arteries, and occlusion of the entire aortobifemoral bypass in both limbs.   He returns to ICU post procedure for infusions of alteplase and heparin overnight, with plans for repeat aortogram and angiogram tomorrow 03/20/18.  PCCM is consulted for medical management while in ICU.  PAST MEDICAL HISTORY :   has a past medical history of Arthritis, Hypertension, and Peripheral vascular disease (HCC).  has a past surgical history that includes Mouth surgery; Lower Extremity Angiography (Right, 10/14/2017); Aorta - bilateral femoral artery bypass graft (Bilateral, 11/13/2017); and Insertion of iliac stent (Right, 11/13/2017). Prior to Admission medications   Medication Sig Start Date End Date Taking? Authorizing Provider  amLODipine (NORVASC) 5 MG tablet Take 1 tablet (5 mg total) by mouth daily. 11/19/17  Yes Dew, Marlow Baars, MD  aspirin EC 81 MG EC tablet Take 1 tablet (81 mg total)  by mouth daily. 11/19/17  Yes Dew, Marlow BaarsJason S, MD  atorvastatin (LIPITOR) 10 MG tablet Take 1 tablet (10 mg total) by mouth daily. 11/18/17 11/18/18 Yes Dew, Marlow BaarsJason S, MD  clopidogrel (PLAVIX) 75 MG tablet Take 1 tablet (75 mg total) by mouth daily. 11/19/17  Yes Dew, Marlow BaarsJason S, MD  labetalol (NORMODYNE) 100 MG tablet Take 1 tablet (100 mg total) by mouth 2 (two) times daily. 11/18/17  Yes Dew, Marlow BaarsJason S, MD  cephALEXin (KEFLEX) 500 MG capsule Take 1 capsule (500 mg total) by mouth 2 (two) times daily. Patient not taking: Reported on 03/18/2018 11/28/17   Georgiana SpinnerBrown, Fallon E, NP  docusate sodium (COLACE) 100 MG capsule Take 1 capsule (100 mg total) by mouth daily. Patient not taking: Reported on 03/18/2018 11/19/17   Annice Needyew, Jason S, MD  oxyCODONE-acetaminophen (PERCOCET/ROXICET) 5-325 MG tablet Take 1-2 tablets by mouth every 4 (four) hours as needed for moderate pain. Patient not taking: Reported on 03/18/2018 11/28/17   Georgiana SpinnerBrown, Fallon E, NP  traMADol (ULTRAM) 50 MG tablet Take 1 tablet (50 mg total) by mouth every 6 (six) hours as needed for severe pain. Patient not taking: Reported on 03/18/2018 10/01/17   Georgiana SpinnerBrown, Fallon E, NP   No Known Allergies  FAMILY HISTORY:  family history includes Heart attack in his father. SOCIAL HISTORY:  reports that he has been smoking. He has never used smokeless tobacco. He reports current alcohol use. He reports current drug use. Drug: Marijuana.  REVIEW OF SYSTEMS:  Positives in BOLD Constitutional: Negative for fever, chills, weight loss, malaise/fatigue and diaphoresis.  HENT: Negative for hearing loss, ear pain, nosebleeds, congestion, sore throat, neck pain, tinnitus and ear discharge.   Eyes: Negative for blurred vision, double vision, photophobia, pain, discharge and redness.  Respiratory: Negative for cough, hemoptysis, sputum production, shortness of breath, wheezing and stridor.   Cardiovascular: Negative for chest pain, palpitations, orthopnea, claudication, leg  swelling and PND. + pain in toes Gastrointestinal: Negative for heartburn, nausea, vomiting, abdominal pain, diarrhea, constipation, blood in stool and melena.  Genitourinary: Negative for dysuria, urgency, frequency, hematuria and flank pain.  Musculoskeletal: Negative for myalgias, back pain, joint pain and falls.  Skin: Negative for itching and rash.  Neurological: Negative for dizziness, tingling, tremors, sensory change, speech change, focal weakness, seizures, loss of consciousness, weakness and headaches.  Endo/Heme/Allergies: Negative for environmental allergies and polydipsia. Does not bruise/bleed easily.  SUBJECTIVE:  Pt denies chest pain, shortness of breath, cough, fever, chills Reports pain in his toes, reports it is tolerable On Room air  VITAL SIGNS: Temp:  [97.6 F (36.4 C)-98.6 F (37 C)] 97.6 F (36.4 C) (02/19 1741) Pulse Rate:  [84-134] 86 (02/19 1817) Resp:  [9-22] 15 (02/19 1741) BP: (109-193)/(59-109) 139/80 (02/19 1817) SpO2:  [96 %-100 %] 100 % (02/19 1741) Weight:  [55.1 kg] 55.1 kg (02/19 1741)  PHYSICAL EXAMINATION: General:  Acutely ill appearing male, laying in bed, on room air, in NAD Neuro:  Sleeping, arouses to voice, A&O, follows commands, no focal deficits, speech clear HEENT:  Atraumatic, normocephalic, neck supple, no JVD Cardiovascular:  RRR, s1s2, no M/R/G Lungs:  Clear to auscultation bilaterally, even, nonlabored, normal effort Abdomen:  Soft, nontender, nondistended, no guarding or rebound tenderness, BS hypoactive Musculoskeletal:  Normal bulk and tone, no deformities, no edema Skin:  Warm/dry.  No obvious rashes, lesions, or ulcerations  Recent Labs  Lab 03/18/18 1421 03/19/18 0520  NA  137 135  K 3.6 3.7  CL 104 103  CO2 22 25  BUN 7 5*  CREATININE 0.72 0.57*  GLUCOSE 198* 132*   Recent Labs  Lab 03/18/18 1421 03/19/18 0520 03/19/18 1835  HGB 15.9 15.3 14.6  HCT 46.3 44.4 42.2  WBC 13.4* 9.1 10.8*  PLT 215 192 157    Ct Angio Aortobifemoral W And/or Wo Contrast  Result Date: 03/18/2018 CLINICAL DATA:  58 year old with history of an aortobifemoral bypass on 11/13/2017. Patient presents with bilateral leg pain and numbness. EXAM: CT ANGIOGRAPHY OF ABDOMINAL AORTA WITH ILIOFEMORAL RUNOFF TECHNIQUE: Multidetector CT imaging of the abdomen, pelvis and lower extremities was performed using the standard protocol during bolus administration of intravenous contrast. Multiplanar CT image reconstructions and MIPs were obtained to evaluate the vascular anatomy. CONTRAST:  OMNIPAQUE IOHEXOL 350 MG/ML SOLN COMPARISON:  None. FINDINGS: VASCULAR Aorta: Occlusion of the infrarenal abdominal aorta. The aortobifemoral bypass is occluded bilaterally. Native distal abdominal aorta is occluded. Celiac: Mild atherosclerotic disease in the proximal celiac trunk without significant stenosis. Left gastric artery, splenic artery and common hepatic artery are patent. SMA: Atherosclerotic disease in the SMA. SMA is patent. No significant stenosis at the origin. Renals: Moderate stenosis in the proximal right renal artery. Right renal artery is patent. At least mild stenosis in the proximal left renal artery. IMA: Reconstitution of the IMA branches.  Origin of IMA is occluded. RIGHT Lower Extremity Inflow: Native right external and common iliac arteries are patent. Right femoral bypass graft is occluded. Small amount of flow in internal iliac artery branches but difficult to evaluate due to calcifications. Outflow: Reconstitution of the right common femoral artery through collateral flow. Stenosis and possible thrombus in the right common femoral arter.y right profunda femoral arteries are patent. Proximal and mid right SFA is occluded. Reconstitution of the very distal SFA. Popliteal artery is patent. Runoff: Tibioperoneal trunk is heavily calcified. Disease and stenosis at the origin of the anterior tibial artery. Peroneal artery and anterior  tibial artery occluded in the mid/distal calf region. Single-vessel runoff from posterior tibial artery. LEFT Lower Extremity Inflow: Native left common and external iliac arteries are occluded. Limited evaluation of the left internal iliac artery branches. Left femoral bypass graft is occluded. Outflow: Minimal reconstitution of the distal left common femoral artery. There is flow in the left deep femoral arteries. Left SFA is occluded. Reconstitution of the mid popliteal artery above the knee. Distal popliteal artery is heavily calcified and diseased. Runoff: Calcifications and disease in the proximal runoff vessels. A small amount of the flow in the proximal runoff vessels. No significant runoff flow in the mid or distal calf. Veins: No obvious venous abnormality within the limitations of this arterial phase study. Review of the MIP images confirms the above findings. NON-VASCULAR Lower chest: Emphysematous changes at the lung bases. No pleural effusions. Several small pulmonary nodules at the lung bases. Largest measures 5 mm in the left lower lobe on sequence 4, image 8. Most of nodules appear to be stable 05/17/2008. There is a questionable nodular density in the lingula on sequence 4, image 6 that was probably present on the study from 10/14/2017. Hepatobiliary: No acute abnormality to the liver or gallbladder. Pancreas: Unremarkable. No pancreatic ductal dilatation or surrounding inflammatory changes. Spleen: Normal in size without focal abnormality. Adrenals/Urinary Tract: Normal adrenal glands. Fluid in the urinary bladder. Normal appearance of both kidneys. No hydronephrosis. No suspicious renal lesions. Stomach/Bowel: Stomach is within normal limits. Appendix appears normal. No  evidence of bowel wall thickening, distention, or inflammatory changes. Lymphatic: No significant lymph node enlargement in the abdomen or pelvis. Reproductive: Prostate is unremarkable. Other: Negative for free fluid.  Negative  for free air. Musculoskeletal: No acute bone abnormality. Subtle lucency along the left iliac wing is chronic. IMPRESSION: VASCULAR 1. 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 2. Reconstitution of the deep femoral arteries bilaterally. Flow in the distal right SFA and right popliteal artery. Reconstitution of the mid left popliteal artery. 3. Severe left runoff disease. No significant flow below the mid left calf. Right runoff disease with single-vessel runoff to the right ankle from the right posterior tibial artery. 4. Bilateral renal artery stenosis. NON-VASCULAR 1. Most of the small pulmonary nodules at the lung bases have not changed since 2010 but there is a questionable indeterminate small nodule in lingula. Non-contrast chest CT can be considered in 12 months based on underlying emphysema. This recommendation follows the consensus statement: Guidelines for Management of Incidental Pulmonary Nodules Detected on CT Images: From the Fleischner Society 2017; Radiology 2017; 284:228-243. These results were called by telephone at the time of interpretation on 03/18/2018 at 4:01 pm to Dr. Alphonzo LemmingsMcShane, who verbally acknowledged these results. Electronically Signed   By: Richarda OverlieAdam  Henn M.D.   On: 03/18/2018 16:31    ASSESSMENT / PLAN:  Peripheral Artery Disease with BLE Rest Pain Complete Occlusion of Infrarenal Abdominal Aorta Hx: PAD, s/p Aortobifemoral bypass in Oct. 2019, HTN -Vascular surgery following, appreciate input -S/p Aortogram & Ileofemoral Angiogram on 2/19 with catheter directed thrombolytic therapy; plans for repeat Aortogram and angiogram 2/20. -Continue Alteplase and Heparin infusions per Vascular Surgery -Strict bed rest; if pt becomes restless may use Precedex -Monitor for S/Sx of bleeding -Trend CBC and Fibrinogen q6h -Transfuse for Hgb <7 -BP control: Maintain SBP <180 and DBP <105; continue Norvasc &Labetolol,  Prn Hydralazine and  Metoprolol -Pain control with prn Morphine and Percocet -Continue Statin -ICU monitoring  History of ETOH abuse Smoking abuse (current smoker) -Cardiac monitoring -CIWA Protocol -Precedex if needed -Continue Thiamine and Multivitamin -Encourage smoking cessation -Nicotine patch     DISPOSITION: ICU GOALS OF CARE: Full code VTE PROPHYLAXIS: Heparin drip UPDATES: Updated pt at bedside 03/19/18.  Harlon DittyJeremiah Keene, AGACNP-BC [facility] Pager: [phone] Cell: [phone]  03/19/2018, 8:06 PM

## 2018-03-19 NOTE — Consult Note (Signed)
ANTICOAGULATION CONSULT NOTE - Initial Consult  Pharmacy Consult for heparin drip management Indication: arterial occlusion / aortic bypass graft  No Known Allergies  Patient Measurements: Height: 5\' 7"  (170.2 cm) Weight: 120 lb (54.4 kg) IBW/kg (Calculated) : 66.1  Vital Signs: BP: 158/95 (02/19 0700) Pulse Rate: 95 (02/19 0700)  Labs: Recent Labs    03/18/18 1421 03/18/18 1635 03/18/18 2200 03/19/18 0520 03/19/18 0904  HGB 15.9  --   --  15.3  --   HCT 46.3  --   --  44.4  --   PLT 215  --   --  192  --   APTT  --  126*  --   --   --   LABPROT 12.7  --   --  12.2  --   INR 0.96  --   --  0.91  --   HEPARINUNFRC  --   --  0.66 1.23* 0.53  CREATININE 0.72  --   --  0.57*  --     Estimated Creatinine Clearance: 79.3 mL/min (A) (by C-G formula based on SCr of 0.57 mg/dL (L)).   Medical History: Past Medical History:  Diagnosis Date  . Arthritis   . Hypertension    no meds  . Peripheral vascular disease (HCC)    pad    Assessment: 57 YOM comes into the ED via EMS from home with c/o BL LE pain with numbness and weakness since Sunday night. He states he has a hx of abd aortic blockage with bypass in October 2019, states this feels similar. CT not completed as of this note. He is on no chronic anticoagulation PTA. Baseline labs have been ordered but not yet resulted.  Goal of Therapy:  Heparin level 0.3-0.7 units/ml Monitor platelets by anticoagulation protocol: Yes   Plan:  Give 3000 units bolus x 1 Start heparin infusion at 900 units/hr Check anti-Xa level in 6 hours and daily while on heparin Continue to monitor H&H and platelets   2/18 PM heparin level 0.66. Continue current regimen.   Recheck with AM labs to confirm - am labs resulted in HL of 1.23 @ 0520 - discussed with Paulino Rily and will be redrawing level now because of concern of accuracy of lab value - will make rate change decision based on follow-up level  Addendum - with recheck and no  interruption of drip or rate change - HL 0.53 @ 0904 - suspicions somewhat confirmed - will continue 900units/hr and recheck tomorrow with am labs  Albina Billet, PharmD, BCPS Clinical Pharmacist 03/19/2018 9:50 AM

## 2018-03-19 NOTE — ED Notes (Signed)
Patient placed in house bed.

## 2018-03-19 NOTE — ED Notes (Signed)
Pt transported to Specials. 

## 2018-03-19 NOTE — H&P (Signed)
[facility] History & Physical Update  The patient was interviewed and re-examined.  The patient's previous History and Physical has been reviewed and is unchanged.  There is no change in the plan of care. We plan to proceed with the scheduled procedure.  Festus Barren, MD  03/19/2018, 11:44 AM

## 2018-03-19 NOTE — Consult Note (Signed)
ANTICOAGULATION CONSULT NOTE - Initial Consult  Pharmacy Consult for heparin drip management Indication: arterial occlusion / aortic bypass graft  No Known Allergies  Patient Measurements: Height: 5\' 7"  (170.2 cm) Weight: 120 lb (54.4 kg) IBW/kg (Calculated) : 66.1  Vital Signs: Temp: 98.6 F (37 C) (02/18 2146) Temp Source: Oral (02/18 2146) BP: 158/95 (02/19 0700) Pulse Rate: 95 (02/19 0700)  Labs: Recent Labs    03/18/18 1421 03/18/18 1635 03/18/18 2200 03/19/18 0520  HGB 15.9  --   --  15.3  HCT 46.3  --   --  44.4  PLT 215  --   --  192  APTT  --  126*  --   --   LABPROT 12.7  --   --  12.2  INR 0.96  --   --  0.91  HEPARINUNFRC  --   --  0.66 1.23*  CREATININE 0.72  --   --  0.57*    Estimated Creatinine Clearance: 79.3 mL/min (A) (by C-G formula based on SCr of 0.57 mg/dL (L)).   Medical History: Past Medical History:  Diagnosis Date  . Arthritis   . Hypertension    no meds  . Peripheral vascular disease (HCC)    pad    Assessment: 57 YOM comes into the ED via EMS from home with c/o BL LE pain with numbness and weakness since Sunday night. He states he has a hx of abd aortic blockage with bypass in October 2019, states this feels similar. CT not completed as of this note. He is on no chronic anticoagulation PTA. Baseline labs have been ordered but not yet resulted.  Goal of Therapy:  Heparin level 0.3-0.7 units/ml Monitor platelets by anticoagulation protocol: Yes   Plan:  Give 3000 units bolus x 1 Start heparin infusion at 900 units/hr Check anti-Xa level in 6 hours and daily while on heparin Continue to monitor H&H and platelets   2/18 PM heparin level 0.66. Continue current regimen.   Recheck with AM labs to confirm - am labs resulted in HL of 1.23 @ 0520 - discussed with Paulino Rily and will be redrawing level now because of concern of accuracy of lab value - will make rate change decision based on follow-up level  Albina Billet,  PharmD, BCPS Clinical Pharmacist 03/19/2018 8:22 AM

## 2018-03-19 NOTE — Op Note (Signed)
[facility] Percutaneous Study/Intervention Procedural Note   Date of Surgery: 03/19/2018  Surgeon(s): Festus Barren  Assistants:none  Pre-operative Diagnosis: PAD with rest pain BLE  Post-operative diagnosis: Same  Procedure(s) Performed: 1. Ultrasound guidance for vascular access bilateral femoral artery 2. Catheter placement into the aorta from bilateral femoral approach 3. Aortogram and ileofemoral angiogram 4. Catheter directed thrombolytic therapy to both limbs of the aortobifemoral bypass in the aorta proximal to the bypass with 4 mg of TPA through both catheters 5. Placement of infusion catheters into both limbs of the aortobifemoral bypass up to the aorta at the level of the renals for continuous infusion of TPA  6.  Right femoral triple-lumen catheter placement with ultrasound and fluoroscopic guidance  EBL: 5 cc  Contrast: 25 cc  Fluoro Time: 4.7 min  Moderate Conscious Sedation time: approximately 30 minutes using 3 mg of Versed and 100 mcg of Fentanyl  Indications: Patient is a 58 y.o. male with occlusion of his aortobifemoral bypass and rest pain bilaterally.  His symptoms started recently. The patient is brought in for angiography for further evaluation and potential treatment. Risks and benefits are discussed and informed consent is obtained  Procedure: The patient was identified and appropriate procedural time out was performed. The patient was then placed supine on the table and prepped and draped in the usual sterile fashion. Moderate conscious sedation was administered throughout the procedure with a face to face encounter with the patient throughout and with my supervision of the RN administering medicines and monitoring the patients vital signs and mental status throughout from the start of the procedure until the patient was taken to the recovery room.   Ultrasound was used to evaluate the right common femoral artery. It was occluded as was the left femoral artery.  Using a micropuncture needle under direct ultrasound guidance I was able to access both femoral arteries at the hood of the femoral anastomosis of the aortobifemoral bypass grafts and passed wires into the limbs of the bypass graft with ultrasound guidance bilaterally.  We upsized to 6 [place] sheaths bilaterally. A digital ultrasound image was acquired. Using a pigtail catheter and the advantage wire I was able to cross the occluded aortobifemoral bypass in the occluded aorta just below the renal arteries and confirm intraluminal flow at the level of the renal arteries from each side.  Imaging of the aorta showed occlusion of the aortobifemoral bypass and the aorta just above the bypass up to the level of the renal arteries.  The renal arteries appeared normal.  This was clearly not a situation we were going to get it open immediately, so infusion catheters and installation of TPA was performed.  I used 90 cm total length 20 cm working length catheters and under fluoroscopic guidance parked these at the level of the renal arteries down to just above the femoral anastomoses bilaterally.  4 mg of TPA was instilled in each catheter and then these were left in place for continuous infusion of thrombolytic therapy overnight.  To gain durable venous access, the right femoral vein was visualized with ultrasound and found to be patent.  It was then accessed under direct ultrasound guidance without difficulty with a Seldinger needle.  A J-wire was placed.  After skin nick and dilatation a triple-lumen catheter was placed over the wire and using fluoroscopic guidance this was parked in the distal inferior vena cava.  It was secured to the skin with Prolene sutures.  The sheaths and the lytic  catheters were secured with Prolene sutures as well.  The patient was taken to the recovery room in stable condition  having tolerated the procedure well.  Findings:  Aortogram: Normal renal arteries with occlusion of the aorta at the level of the renals.  Occlusion of the entire aortabifemoral bypass in both limbs down to the femorals   Disposition: Patient was taken to the recovery room in stable condition having tolerated the procedure well.  Complications: None  Festus Barren 03/19/2018 3:07 PM     This note was created with Dragon Medical transcription system. Any errors in dictation are purely unintentional.

## 2018-03-19 NOTE — ED Notes (Addendum)
Patients breathing treatment complete. Turned off.

## 2018-03-19 NOTE — ED Notes (Signed)
Patient reports pain increase 9/10 to bilateral lower extremities. . Hypertensive. PRN meds given will monitor pain level and vital signs.

## 2018-03-19 NOTE — Consult Note (Signed)
ANTICOAGULATION CONSULT NOTE - Initial Consult  Pharmacy Consult for heparin drip management Indication: arterial occlusion / aortic bypass graft  No Known Allergies  Patient Measurements: Height: 5\' 7"  (170.2 cm) Weight: 120 lb (54.4 kg) IBW/kg (Calculated) : 66.1  Vital Signs: Temp: 98.1 F (36.7 C) (02/19 1041) Temp Source: Oral (02/19 1041) BP: 186/105 (02/19 1310) Pulse Rate: 112 (02/19 1310)  Labs: Recent Labs    03/18/18 1421 03/18/18 1635 03/18/18 2200 03/19/18 0520 03/19/18 0904  HGB 15.9  --   --  15.3  --   HCT 46.3  --   --  44.4  --   PLT 215  --   --  192  --   APTT  --  126*  --   --   --   LABPROT 12.7  --   --  12.2  --   INR 0.96  --   --  0.91  --   HEPARINUNFRC  --   --  0.66 1.23* 0.53  CREATININE 0.72  --   --  0.57*  --     Estimated Creatinine Clearance: 79.3 mL/min (A) (by C-G formula based on SCr of 0.57 mg/dL (L)).   Medical History: Past Medical History:  Diagnosis Date  . Arthritis   . Hypertension    no meds  . Peripheral vascular disease (HCC)    pad    Assessment: 57 YOM comes into the ED via EMS from home with c/o BL LE pain with numbness and weakness since Sunday night. He states he has a hx of abd aortic blockage with bypass in October 2019, states this feels similar. CT not completed as of this note. He is on no chronic anticoagulation PTA. Baseline labs have been ordered but not yet resulted.  Goal of Therapy:  Vascular has set therapeutic HL goal in this patient to 0.2-0.5 Monitor platelets by anticoagulation protocol: Yes   Plan:  Give 3000 units bolus x 1 Start heparin infusion at 900 units/hr Check anti-Xa level in 6 hours and daily while on heparin Continue to monitor H&H and platelets   HL 0.53 @ 0904 continued 900units/hr   Post procedure - Vascular set therapeutic goal @ 0.2-0.5 - adjusted rate to 800 units/hr - Vascular set level checks every 6 hours starting @ 1305 - will set consult follow-up for  1900  Albina Billet, PharmD, BCPS Clinical Pharmacist 03/19/2018 1:29 PM

## 2018-03-19 NOTE — Consult Note (Signed)
ANTICOAGULATION CONSULT NOTE - Initial Consult  Pharmacy Consult for heparin drip management Indication: arterial occlusion / aortic bypass graft  No Known Allergies  Patient Measurements: Height: 5\' 7"  (170.2 cm) Weight: 121 lb 7.6 oz (55.1 kg) IBW/kg (Calculated) : 66.1  Vital Signs: Temp: 97.6 F (36.4 C) (02/19 1741) Temp Source: Oral (02/19 1741) BP: 139/80 (02/19 1817) Pulse Rate: 86 (02/19 1817)  Labs: Recent Labs    03/18/18 1421 03/18/18 1635  03/19/18 0520 03/19/18 0904 03/19/18 1835  HGB 15.9  --   --  15.3  --  14.6  HCT 46.3  --   --  44.4  --  42.2  PLT 215  --   --  192  --  157  APTT  --  126*  --   --   --   --   LABPROT 12.7  --   --  12.2  --   --   INR 0.96  --   --  0.91  --   --   HEPARINUNFRC  --   --    < > 1.23* 0.53 0.33  CREATININE 0.72  --   --  0.57*  --   --    < > = values in this interval not displayed.    Estimated Creatinine Clearance: 80.4 mL/min (A) (by C-G formula based on SCr of 0.57 mg/dL (L)).   Medical History: Past Medical History:  Diagnosis Date  . Arthritis   . Hypertension    no meds  . Peripheral vascular disease (HCC)    pad    Assessment: 57 YOM comes into the ED via EMS from home with c/o BL LE pain with numbness and weakness since Sunday night. He states he has a hx of abd aortic blockage with bypass in October 2019, states this feels similar. CT not completed as of this note. He is on no chronic anticoagulation PTA. Baseline labs have been ordered but not yet resulted.  Goal of Therapy:  Vascular has set therapeutic HL goal in this patient to 0.2-0.5 Monitor platelets by anticoagulation protocol: Yes   Plan:  Post procedure - Vascular set therapeutic goal @ 0.2-0.5 - adjusted rate to 800 units/hr - Vascular set level checks every 6 hours.  2/19 @ 1835 HL 0.33. Will continue current infusion rate of 800 units/hr  (400units/hr in each sheath). Next heparin level @ 2300.   Gardner Candle, PharmD,  BCPS Clinical Pharmacist 03/19/2018 7:29 PM

## 2018-03-19 NOTE — Progress Notes (Signed)
[facility] [place] [facility]   PATIENT NAME: Kyle Gordon    MR#:  [id number]  DATE OF BIRTH:  12/27/1960  SUBJECTIVE: Patient admitted for bilateral leg pain, seen in the emergency room, he complains of more pain in the left leg.  CHIEF COMPLAINT:   Chief Complaint  Patient presents with  . Leg Pain    REVIEW OF SYSTEMS:   ROS CONSTITUTIONAL: No fever, fatigue or weakness.  EYES: No blurred or double vision.  EARS, NOSE, AND THROAT: No tinnitus or ear pain.  RESPIRATORY: No cough, shortness of breath, wheezing or hemoptysis.  CARDIOVASCULAR: No chest pain, orthopnea, edema.  GASTROINTESTINAL: No nausea, vomiting, diarrhea or abdominal pain.  GENITOURINARY: No dysuria, hematuria.  ENDOCRINE: No polyuria, nocturia,  HEMATOLOGY: No anemia, easy bruising or bleeding SKIN: No rash or lesion. MUSCULOSKELETAL ; bilateral leg pain. NEUROLOGIC: No tingling, numbness, weakness.  PSYCHIATRY: No anxiety or depression.   DRUG ALLERGIES:  No Known Allergies  VITALS:  Blood pressure (!) 186/105, pulse (!) 112, temperature 98.1 F (36.7 C), temperature source Oral, resp. rate 14, height 5\' 7"  (1.702 m), weight 54.4 kg, SpO2 100 %.  PHYSICAL EXAMINATION:  GENERAL:  58 y.o.-year-old patient lying in the bed with no acute distress.  EYES: Pupils equal, round, reactive to light and accommodation. No scleral icterus. Extraocular muscles intact.  HEENT: Head atraumatic, normocephalic. Oropharynx and nasopharynx clear.  NECK:  Supple, no jugular venous distention. No thyroid enlargement, no tenderness.  LUNGS: Normal breath sounds bilaterally, no wheezing, rales,rhonchi or crepitation. No use of accessory muscles of respiration.  CARDIOVASCULAR: S1, S2 normal. No murmurs, rubs, or gallops.  ABDOMEN: Soft, nontender, nondistended. Bowel sounds present. No organomegaly or mass.  EXTREMITIES: Patient has palpable radial, ulnar, but nonpalpable popliteal, DP,  PT on both sides. NEUROLOGIC: Cranial nerves II through XII are intact. Muscle strength 5/5 in all extremities. Sensation intact. Gait not checked.  PSYCHIATRIC: The patient is alert and oriented x 3.  SKIN: No obvious rash, lesion, or ulcer.    LABORATORY PANEL:   CBC Recent Labs  Lab 03/19/18 0520  WBC 9.1  HGB 15.3  HCT 44.4  PLT 192   ------------------------------------------------------------------------------------------------------------------  Chemistries  Recent Labs  Lab 03/18/18 1421 03/19/18 0520  NA 137 135  K 3.6 3.7  CL 104 103  CO2 22 25  GLUCOSE 198* 132*  BUN 7 5*  CREATININE 0.72 0.57*  CALCIUM 9.6 9.0  MG  --  1.9  AST 30  --   ALT 16  --   ALKPHOS 99  --   BILITOT 1.1  --    ------------------------------------------------------------------------------------------------------------------  Cardiac Enzymes No results for input(s): TROPONINI in the last 168 hours. ------------------------------------------------------------------------------------------------------------------  RADIOLOGY:  Ct Angio Aortobifemoral W And/or Wo Contrast  Result Date: 03/18/2018 CLINICAL DATA:  58 year old with history of an aortobifemoral bypass on 11/13/2017. Patient presents with bilateral leg pain and numbness. EXAM: CT ANGIOGRAPHY OF ABDOMINAL AORTA WITH ILIOFEMORAL RUNOFF TECHNIQUE: Multidetector CT imaging of the abdomen, pelvis and lower extremities was performed using the standard protocol during bolus administration of intravenous contrast. Multiplanar CT image reconstructions and MIPs were obtained to evaluate the vascular anatomy. CONTRAST:  OMNIPAQUE IOHEXOL 350 MG/ML SOLN COMPARISON:  None. FINDINGS: VASCULAR Aorta: Occlusion of the infrarenal abdominal aorta. The aortobifemoral bypass is occluded bilaterally. Native distal abdominal aorta is occluded. Celiac: Mild atherosclerotic disease in the proximal celiac trunk without significant stenosis. Left  gastric artery, splenic artery and common hepatic  artery are patent. SMA: Atherosclerotic disease in the SMA. SMA is patent. No significant stenosis at the origin. Renals: Moderate stenosis in the proximal right renal artery. Right renal artery is patent. At least mild stenosis in the proximal left renal artery. IMA: Reconstitution of the IMA branches.  Origin of IMA is occluded. RIGHT Lower Extremity Inflow: Native right external and common iliac arteries are patent. Right femoral bypass graft is occluded. Small amount of flow in internal iliac artery branches but difficult to evaluate due to calcifications. Outflow: Reconstitution of the right common femoral artery through collateral flow. Stenosis and possible thrombus in the right common femoral arter.y right profunda femoral arteries are patent. Proximal and mid right SFA is occluded. Reconstitution of the very distal SFA. Popliteal artery is patent. Runoff: Tibioperoneal trunk is heavily calcified. Disease and stenosis at the origin of the anterior tibial artery. Peroneal artery and anterior tibial artery occluded in the mid/distal calf region. Single-vessel runoff from posterior tibial artery. LEFT Lower Extremity Inflow: Native left common and external iliac arteries are occluded. Limited evaluation of the left internal iliac artery branches. Left femoral bypass graft is occluded. Outflow: Minimal reconstitution of the distal left common femoral artery. There is flow in the left deep femoral arteries. Left SFA is occluded. Reconstitution of the mid popliteal artery above the knee. Distal popliteal artery is heavily calcified and diseased. Runoff: Calcifications and disease in the proximal runoff vessels. A small amount of the flow in the proximal runoff vessels. No significant runoff flow in the mid or distal calf. Veins: No obvious venous abnormality within the limitations of this arterial phase study. Review of the MIP images confirms the above findings.  NON-VASCULAR Lower chest: Emphysematous changes at the lung bases. No pleural effusions. Several small pulmonary nodules at the lung bases. Largest measures 5 mm in the left lower lobe on sequence 4, image 8. Most of nodules appear to be stable 05/17/2008. There is a questionable nodular density in the lingula on sequence 4, image 6 that was probably present on the study from 10/14/2017. Hepatobiliary: No acute abnormality to the liver or gallbladder. Pancreas: Unremarkable. No pancreatic ductal dilatation or surrounding inflammatory changes. Spleen: Normal in size without focal abnormality. Adrenals/Urinary Tract: Normal adrenal glands. Fluid in the urinary bladder. Normal appearance of both kidneys. No hydronephrosis. No suspicious renal lesions. Stomach/Bowel: Stomach is within normal limits. Appendix appears normal. No evidence of bowel wall thickening, distention, or inflammatory changes. Lymphatic: No significant lymph node enlargement in the abdomen or pelvis. Reproductive: Prostate is unremarkable. Other: Negative for free fluid.  Negative for free air. Musculoskeletal: No acute bone abnormality. Subtle lucency along the left iliac wing is chronic. IMPRESSION: VASCULAR 1. 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 2. Reconstitution of the deep femoral arteries bilaterally. Flow in the distal right SFA and right popliteal artery. Reconstitution of the mid left popliteal artery. 3. Severe left runoff disease. No significant flow below the mid left calf. Right runoff disease with single-vessel runoff to the right ankle from the right posterior tibial artery. 4. Bilateral renal artery stenosis. NON-VASCULAR 1. Most of the small pulmonary nodules at the lung bases have not changed since 2010 but there is a questionable indeterminate small nodule in lingula. Non-contrast chest CT can be considered in 12 months based on underlying emphysema. This recommendation  follows the consensus statement: Guidelines for Management of Incidental Pulmonary Nodules Detected on CT Images: From the Fleischner Society 2017; Radiology 2017; 284:228-243. These  results were called by telephone at the time of interpretation on 03/18/2018 at 4:01 pm to Dr. Alphonzo LemmingsMcShane, who verbally acknowledged these results. Electronically Signed   By: Richarda OverlieAdam  Henn M.D.   On: 03/18/2018 16:31    EKG:   Orders placed or performed during the hospital encounter of 03/18/18  . EKG 12-Lead  . EKG 12-Lead    ASSESSMENT AND PLAN:   58 year old male patient with history of hypertension, tobacco abuse, PAD, previous history of aortofemoral bypass comes in because of bilateral leg pain, found to have occluded aorto bifemoral bypass on CT angios.  #1 .acute bilateral limb ischemia, occlusion of bifemoral bypass, seen by vascular, patient started on heparin drip, patient is scheduled for aortogram today morning in attempt to revascularize both legs,  #2 essential hypertension; uncontrolled, add IV hydralazine while patient is n.p.o., patient can get 20 mg of IV hydralazine every 4-6 hours as needed for  bp more than 160/90    3.  History of PAD, continue aspirin, statins, now on heparin drip, patient told me that he stopped taking medicines aspirin, Plavix, statins 1 month ago thought that he does not need to take anymore.  Advised the patient that he needs to continue indefinitely at this time.  All the records are reviewed and case discussed with Care Management/Social Workerr. Management plans discussed with the patient, family and they are in agreement.  CODE STATUS: Full code  TOTAL TIME TAKING CARE OF THIS PATIENT: 38 minutes.   POSSIBLE D/C IN 2-3 DAYS, DEPENDING ON CLINICAL CONDITION. More than 50% time spent in counseling, coordination of care  Katha HammingSnehalatha Jemiah Ellenburg M.D on 03/19/2018 at 1:21 PM  Between 7am to 6pm - Pager - [phone]  After 6pm go to [email or web address] - password [email or web address]  [facility]  [facility]  Office  [phone]  CC: Primary care physician; Patient, No Pcp Per   Note: This dictation was prepared with Dragon dictation along with smaller phrase technology. Any transcriptional errors that result from this process are unintentional.

## 2018-03-20 ENCOUNTER — Encounter: Payer: Self-pay | Admitting: Vascular Surgery

## 2018-03-20 ENCOUNTER — Encounter
Admission: EM | Disposition: A | Discharge status: Home / Self Care | Payer: Self-pay | Source: Home / Self Care | Attending: Family Medicine

## 2018-03-20 DIAGNOSIS — I70223 Atherosclerosis of native arteries of extremities with rest pain, bilateral legs: Secondary | ICD-10-CM

## 2018-03-20 DIAGNOSIS — T82868A Thrombosis of vascular prosthetic devices, implants and grafts, initial encounter: Secondary | ICD-10-CM

## 2018-03-20 HISTORY — PX: ABDOMINAL AORTOGRAM: CATH118222

## 2018-03-20 LAB — CBC
HCT: 38.9 % — ABNORMAL LOW (ref 39.0–52.0)
HCT: 36.6 % — ABNORMAL LOW (ref 39.0–52.0)
HEMOGLOBIN: 12.6 g/dL — AB (ref 13.0–17.0)
Hemoglobin: 13.5 g/dL (ref 13.0–17.0)
MCH: 34.8 pg — ABNORMAL HIGH (ref 26.0–34.0)
MCH: 34.6 pg — ABNORMAL HIGH (ref 26.0–34.0)
MCHC: 34.7 g/dL (ref 30.0–36.0)
MCHC: 34.4 g/dL (ref 30.0–36.0)
MCV: 100.3 fL — ABNORMAL HIGH (ref 80.0–100.0)
MCV: 100.5 fL — ABNORMAL HIGH (ref 80.0–100.0)
PLATELETS: 133 10*3/uL — AB (ref 150–400)
Platelets: 152 10*3/uL (ref 150–400)
RBC: 3.88 MIL/uL — ABNORMAL LOW (ref 4.22–5.81)
RBC: 3.64 MIL/uL — ABNORMAL LOW (ref 4.22–5.81)
RDW: 13.3 % (ref 11.5–15.5)
RDW: 13.2 % (ref 11.5–15.5)
WBC: 11.7 10*3/uL — ABNORMAL HIGH (ref 4.0–10.5)
WBC: 11.5 10*3/uL — AB (ref 4.0–10.5)
nRBC: 0 % (ref 0.0–0.2)
nRBC: 0 % (ref 0.0–0.2)

## 2018-03-20 LAB — BASIC METABOLIC PANEL
Anion gap: 7 (ref 5–15)
BUN: 10 mg/dL (ref 6–20)
CO2: 24 mmol/L (ref 22–32)
Calcium: 8.2 mg/dL — ABNORMAL LOW (ref 8.9–10.3)
Chloride: 103 mmol/L (ref 98–111)
Creatinine, Ser: 0.71 mg/dL (ref 0.61–1.24)
GFR calc Af Amer: >60 mL/min (ref >60)
GFR calc non Af Amer: >60 mL/min (ref >60)
Glucose, Bld: 121 mg/dL — ABNORMAL HIGH (ref 70–99)
POTASSIUM: 3.5 mmol/L (ref 3.5–5.1)
Sodium: 134 mmol/L — ABNORMAL LOW (ref 135–145)

## 2018-03-20 LAB — HEPARIN LEVEL (UNFRACTIONATED)
Heparin Unfractionated: 0.42 IU/mL (ref 0.30–0.70)
Heparin Unfractionated: 0.43 IU/mL (ref 0.30–0.70)
Heparin Unfractionated: 0.35 IU/mL (ref 0.30–0.70)

## 2018-03-20 LAB — FIBRINOGEN
FIBRINOGEN: 239 mg/dL (ref 210–475)
Fibrinogen: 206 mg/dL — ABNORMAL LOW (ref 210–475)

## 2018-03-20 LAB — HIV ANTIBODY (ROUTINE TESTING W REFLEX): HIV Screen 4th Generation wRfx: NONREACTIVE

## 2018-03-20 SURGERY — ABDOMINAL AORTOGRAM
Anesthesia: Moderate Sedation

## 2018-03-20 MED ORDER — HEPARIN SODIUM (PORCINE) 1000 UNIT/ML IJ SOLN
INTRAMUSCULAR | Status: DC | PRN
  Administered 2018-03-20: 4000 [IU] via INTRAVENOUS

## 2018-03-20 MED ORDER — MIDAZOLAM HCL 5 MG/5ML IJ SOLN
INTRAMUSCULAR | Status: AC
  Filled 2018-03-20: qty 5

## 2018-03-20 MED ORDER — HEPARIN SODIUM (PORCINE) 1000 UNIT/ML IJ SOLN
INTRAMUSCULAR | Status: AC
  Filled 2018-03-20: qty 1

## 2018-03-20 MED ORDER — MIDAZOLAM HCL 2 MG/2ML IJ SOLN
INTRAMUSCULAR | Status: DC | PRN
  Administered 2018-03-20: 1 mg via INTRAVENOUS
  Administered 2018-03-20: 2 mg via INTRAVENOUS

## 2018-03-20 MED ORDER — CEFAZOLIN SODIUM-DEXTROSE 2-4 GM/100ML-% IV SOLN
INTRAVENOUS | Status: AC
  Administered 2018-03-20: 2 g
  Filled 2018-03-20: qty 100

## 2018-03-20 MED ORDER — FENTANYL CITRATE (PF) 100 MCG/2ML IJ SOLN
INTRAMUSCULAR | Status: DC | PRN
  Administered 2018-03-20 (×3): 50 ug via INTRAVENOUS

## 2018-03-20 MED ORDER — APIXABAN 5 MG PO TABS
5.0000 mg | ORAL_TABLET | Freq: Two times a day (BID) | ORAL | Status: DC
  Administered 2018-03-21 – 2018-03-22 (×3): 5 mg via ORAL
  Filled 2018-03-20 (×3): qty 1

## 2018-03-20 MED ORDER — HEPARIN (PORCINE) IN NACL 1000-0.9 UT/500ML-% IV SOLN
INTRAVENOUS | Status: AC
  Filled 2018-03-20: qty 1000

## 2018-03-20 MED ORDER — HEPARIN (PORCINE) 25000 UT/250ML-% IV SOLN
800.0000 [IU]/h | INTRAVENOUS | Status: DC
  Administered 2018-03-20: 800 [IU]/h via INTRAVENOUS
  Filled 2018-03-20: qty 250

## 2018-03-20 MED ORDER — FENTANYL CITRATE (PF) 100 MCG/2ML IJ SOLN
INTRAMUSCULAR | Status: AC
  Filled 2018-03-20: qty 4

## 2018-03-20 MED ORDER — LIDOCAINE-EPINEPHRINE (PF) 1 %-1:200000 IJ SOLN
INTRAMUSCULAR | Status: AC
  Filled 2018-03-20: qty 30

## 2018-03-20 MED ORDER — IOPAMIDOL (ISOVUE-300) INJECTION 61%
INTRAVENOUS | Status: DC | PRN
  Administered 2018-03-20: 70 mL via INTRA_ARTERIAL

## 2018-03-20 SURGICAL SUPPLY — 11 items
CANISTER PENUMBRA ENGINE (MISCELLANEOUS) ×3 IMPLANT
CATH INDIGO CAT6 KIT (CATHETERS) ×3 IMPLANT
CATH PIG 70CM (CATHETERS) ×3 IMPLANT
DEVICE PRESTO INFLATION (MISCELLANEOUS) ×6 IMPLANT
DEVICE SAFEGUARD 24CM (GAUZE/BANDAGES/DRESSINGS) ×6 IMPLANT
DEVICE STARCLOSE SE CLOSURE (Vascular Products) ×6 IMPLANT
PACK ANGIOGRAPHY (CUSTOM PROCEDURE TRAY) ×3 IMPLANT
SHEATH BRITE TIP 7FRX11 (SHEATH) ×6 IMPLANT
STENT LIFESTREAM 8X58X80 (Permanent Stent) ×6 IMPLANT
TUBING CONTRAST HIGH PRESS 72 (TUBING) ×3 IMPLANT
WIRE MAGIC TOR.035 180C (WIRE) ×6 IMPLANT

## 2018-03-20 NOTE — Progress Notes (Signed)
At 0445 oozing from left groin site.  Gauze saturated with small hematoma.   Pressure held for 20 minutes.  Hematoma unchanged.  No bleeding at site.  AM labs drawn and sent.  Continue to monitor.  NP notified.

## 2018-03-20 NOTE — Progress Notes (Addendum)
[facility] - [place] at [facility]   PATIENT NAME: Kyle Gordon    MR#:  [id number]  DATE OF BIRTH:  06/27/1960  SUBJECTIVE: Patient admitted for bilateral leg pain, seen in the emergency room, he complains of more pain in the left leg.  CHIEF COMPLAINT:   Patient inquiring further vascular intervention, status post thrombolytics on March 19, 2018  REVIEW OF SYSTEMS:   ROS CONSTITUTIONAL: No fever, fatigue or weakness.  EYES: No blurred or double vision.  EARS, NOSE, AND THROAT: No tinnitus or ear pain.  RESPIRATORY: No cough, shortness of breath, wheezing or hemoptysis.  CARDIOVASCULAR: No chest pain, orthopnea, edema.  GASTROINTESTINAL: No nausea, vomiting, diarrhea or abdominal pain.  GENITOURINARY: No dysuria, hematuria.  ENDOCRINE: No polyuria, nocturia,  HEMATOLOGY: No anemia, easy bruising or bleeding SKIN: No rash or lesion. MUSCULOSKELETAL ; bilateral leg pain. NEUROLOGIC: No tingling, numbness, weakness.  PSYCHIATRY: No anxiety or depression.   DRUG ALLERGIES:  No Known Allergies  VITALS:  Blood pressure 104/65, pulse 84, temperature 98.8 F (37.1 C), temperature source Oral, resp. rate 16, height 5\' 7"  (1.702 m), weight 55.1 kg, SpO2 100 %.  PHYSICAL EXAMINATION:  GENERAL:  58 y.o.-year-old patient lying in the bed with no acute distress.  EYES: Pupils equal, round, reactive to light and accommodation. No scleral icterus. Extraocular muscles intact.  HEENT: Head atraumatic, normocephalic. Oropharynx and nasopharynx clear.  NECK:  Supple, no jugular venous distention. No thyroid enlargement, no tenderness.  LUNGS: Normal breath sounds bilaterally, no wheezing, rales,rhonchi or crepitation. No use of accessory muscles of respiration.  CARDIOVASCULAR: S1, S2 normal. No murmurs, rubs, or gallops.  ABDOMEN: Soft, nontender, nondistended. Bowel sounds present. No organomegaly or mass.  EXTREMITIES: Patient has palpable radial, ulnar,  but nonpalpable popliteal, DP, PT on both sides. NEUROLOGIC: Cranial nerves II through XII are intact. Muscle strength 5/5 in all extremities. Sensation intact. Gait not checked.  PSYCHIATRIC: The patient is alert and oriented x 3.  SKIN: No obvious rash, lesion, or ulcer.    LABORATORY PANEL:   CBC Recent Labs  Lab 03/20/18 0449  WBC 11.7*  HGB 12.6*  HCT 36.6*  PLT 152   ------------------------------------------------------------------------------------------------------------------  Chemistries  Recent Labs  Lab 03/18/18 1421 03/19/18 0520 03/20/18 0449  NA 137 135 134*  K 3.6 3.7 3.5  CL 104 103 103  CO2 22 25 24   GLUCOSE 198* 132* 121*  BUN 7 5* 10  CREATININE 0.72 0.57* 0.71  CALCIUM 9.6 9.0 8.2*  MG  --  1.9  --   AST 30  --   --   ALT 16  --   --   ALKPHOS 99  --   --   BILITOT 1.1  --   --    ------------------------------------------------------------------------------------------------------------------  Cardiac Enzymes No results for input(s): TROPONINI in the last 168 hours. ------------------------------------------------------------------------------------------------------------------  RADIOLOGY:  Ct Angio Aortobifemoral W And/or Wo Contrast  Result Date: 03/18/2018 CLINICAL DATA:  58 year old with history of an aortobifemoral bypass on 11/13/2017. Patient presents with bilateral leg pain and numbness. EXAM: CT ANGIOGRAPHY OF ABDOMINAL AORTA WITH ILIOFEMORAL RUNOFF TECHNIQUE: Multidetector CT imaging of the abdomen, pelvis and lower extremities was performed using the standard protocol during bolus administration of intravenous contrast. Multiplanar CT image reconstructions and MIPs were obtained to evaluate the vascular anatomy. CONTRAST:  OMNIPAQUE IOHEXOL 350 MG/ML SOLN COMPARISON:  None. FINDINGS: VASCULAR Aorta: Occlusion of the infrarenal abdominal aorta. The aortobifemoral bypass is occluded bilaterally. Native distal abdominal aorta  is  occluded. Celiac: Mild atherosclerotic disease in the proximal celiac trunk without significant stenosis. Left gastric artery, splenic artery and common hepatic artery are patent. SMA: Atherosclerotic disease in the SMA. SMA is patent. No significant stenosis at the origin. Renals: Moderate stenosis in the proximal right renal artery. Right renal artery is patent. At least mild stenosis in the proximal left renal artery. IMA: Reconstitution of the IMA branches.  Origin of IMA is occluded. RIGHT Lower Extremity Inflow: Native right external and common iliac arteries are patent. Right femoral bypass graft is occluded. Small amount of flow in internal iliac artery branches but difficult to evaluate due to calcifications. Outflow: Reconstitution of the right common femoral artery through collateral flow. Stenosis and possible thrombus in the right common femoral arter.y right profunda femoral arteries are patent. Proximal and mid right SFA is occluded. Reconstitution of the very distal SFA. Popliteal artery is patent. Runoff: Tibioperoneal trunk is heavily calcified. Disease and stenosis at the origin of the anterior tibial artery. Peroneal artery and anterior tibial artery occluded in the mid/distal calf region. Single-vessel runoff from posterior tibial artery. LEFT Lower Extremity Inflow: Native left common and external iliac arteries are occluded. Limited evaluation of the left internal iliac artery branches. Left femoral bypass graft is occluded. Outflow: Minimal reconstitution of the distal left common femoral artery. There is flow in the left deep femoral arteries. Left SFA is occluded. Reconstitution of the mid popliteal artery above the knee. Distal popliteal artery is heavily calcified and diseased. Runoff: Calcifications and disease in the proximal runoff vessels. A small amount of the flow in the proximal runoff vessels. No significant runoff flow in the mid or distal calf. Veins: No obvious venous  abnormality within the limitations of this arterial phase study. Review of the MIP images confirms the above findings. NON-VASCULAR Lower chest: Emphysematous changes at the lung bases. No pleural effusions. Several small pulmonary nodules at the lung bases. Largest measures 5 mm in the left lower lobe on sequence 4, image 8. Most of nodules appear to be stable 05/17/2008. There is a questionable nodular density in the lingula on sequence 4, image 6 that was probably present on the study from 10/14/2017. Hepatobiliary: No acute abnormality to the liver or gallbladder. Pancreas: Unremarkable. No pancreatic ductal dilatation or surrounding inflammatory changes. Spleen: Normal in size without focal abnormality. Adrenals/Urinary Tract: Normal adrenal glands. Fluid in the urinary bladder. Normal appearance of both kidneys. No hydronephrosis. No suspicious renal lesions. Stomach/Bowel: Stomach is within normal limits. Appendix appears normal. No evidence of bowel wall thickening, distention, or inflammatory changes. Lymphatic: No significant lymph node enlargement in the abdomen or pelvis. Reproductive: Prostate is unremarkable. Other: Negative for free fluid.  Negative for free air. Musculoskeletal: No acute bone abnormality. Subtle lucency along the left iliac wing is chronic. IMPRESSION: VASCULAR 1. 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 2. Reconstitution of the deep femoral arteries bilaterally. Flow in the distal right SFA and right popliteal artery. Reconstitution of the mid left popliteal artery. 3. Severe left runoff disease. No significant flow below the mid left calf. Right runoff disease with single-vessel runoff to the right ankle from the right posterior tibial artery. 4. Bilateral renal artery stenosis. NON-VASCULAR 1. Most of the small pulmonary nodules at the lung bases have not changed since 2010 but there is a questionable indeterminate small  nodule in lingula. Non-contrast chest CT can be considered in 12 months based on underlying emphysema. This recommendation follows  the consensus statement: Guidelines for Management of Incidental Pulmonary Nodules Detected on CT Images: From the Fleischner Society 2017; Radiology 2017; 284:228-243. These results were called by telephone at the time of interpretation on 03/18/2018 at 4:01 pm to Dr. Alphonzo Lemmings, who verbally acknowledged these results. Electronically Signed   By: Richarda Overlie M.D.   On: 03/18/2018 16:31    EKG:   Orders placed or performed during the hospital encounter of 03/18/18  . EKG 12-Lead  . EKG 12-Lead    ASSESSMENT AND PLAN:  58 year old male patient with history of hypertension, tobacco abuse, PAD, previous history of aortofemoral bypass comes in because of bilateral leg pain, found to have occluded aorto bifemoral bypass on CT angios.  *acute bilateral limb ischemia, occlusion of bifemoral bypass Treated initially on heparin drip, status post aortogram/iliofemoral angiogram with TPA March 19, 2018 by vascular surgery/Dr. dew,, seen by vascular, patient started on heparin drip, patient is scheduled for aortogram today morning in attempt to revascularize both legs, further evaluation later today  *Chronic hypertension Controlled on current regiment  *History of PAD patient told me that he stopped taking medicines aspirin, Plavix, statins 1 month ago thought that he does not need to take anymore.  Advised the patient that he needs to continue indefinitely    All the records are reviewed and case discussed with Care Management/Social Workerr. Management plans discussed with the patient, family and they are in agreement.  CODE STATUS: Full code  TOTAL TIME TAKING CARE OF THIS PATIENT: 38 minutes.   POSSIBLE D/C IN 2-3 DAYS, DEPENDING ON CLINICAL CONDITION. More than 50% time spent in counseling, coordination of care  Evelena Asa Molina Hollenback M.D on 03/20/2018 at 12:52  PM  Between 7am to 6pm - Pager - [phone]  After 6pm go to [email or web address] - password [email or web address]  [facility]  Office  [phone]  CC: Primary care physician; Patient, No Pcp Per   Note: This dictation was prepared with Dragon dictation along with smaller phrase technology. Any transcriptional errors that result from this process are unintentional.

## 2018-03-20 NOTE — Care Management Note (Signed)
Case Management Note  Patient Details  Name: BELAL ALVARDO MRN: [id number] Date of Birth: November 26, 1960  Subjective/Objective:    Patient admitted with PAD with rest pain.  Patient went to OR yesterday for aortogram and ileofemoral angiogram, bilateral femoral catheters left in place with TPA infusion.  Patient has gone back to the OR today for follow up.  RNCM consulted for Medication assistance.  Patient does not have insurance and no PCP listed.  Patient is currently off the unit in procedure will follow up at a later time. Robbie Lis RN BSN [phone]                Action/Plan:   Expected Discharge Date:                  Expected Discharge Plan:     In-House Referral:     Discharge planning Services     Post Acute Care Choice:    Choice offered to:     DME Arranged:    DME Agency:     HH Arranged:    HH Agency:     Status of Service:     If discussed at Long Length of Stay Meetings, dates discussed:    Additional Comments:  Allayne Butcher, RN 03/20/2018, 11:22 AM

## 2018-03-20 NOTE — Progress Notes (Signed)
Patient remains clinically stable post procedure, bil. Groin sites without bleeding nor hematoma. PAD to each site. To restart heparin gtt back at pharmacy dose per Dr Wyn Quaker. Report called to [facility] RN on CCU. Questions answered. Patient dozing at intervals. Without complaints.

## 2018-03-20 NOTE — Progress Notes (Signed)
ANTICOAGULATION CONSULT NOTE - Follow Up Consult  Pharmacy Consult for Heparin Indication: S/P Thrombectomy  No Known Allergies  Patient Measurements: Height: 5\' 7"  (170.2 cm) Weight: 121 lb 7.6 oz (55.1 kg) IBW/kg (Calculated) : 66.1 Heparin Dosing Weight: 55.1 kg  Vital Signs: Temp: 98.8 F (37.1 C) (02/20 1129) Temp Source: Oral (02/20 1129) BP: 102/63 (02/20 1400) Pulse Rate: 84 (02/20 1415)  Labs: Recent Labs    03/18/18 1421 03/18/18 1635  03/19/18 0520  03/19/18 1835 03/20/18 0047 03/20/18 0449  HGB 15.9  --   --  15.3  --  14.6 13.5 12.6*  HCT 46.3  --   --  44.4  --  42.2 38.9* 36.6*  PLT 215  --   --  192  --  157 133* 152  APTT  --  126*  --   --   --   --   --   --   LABPROT 12.7  --   --  12.2  --   --   --   --   INR 0.96  --   --  0.91  --   --   --   --   HEPARINUNFRC  --   --    < > 1.23*   < > 0.33 0.42 0.43  CREATININE 0.72  --   --  0.57*  --   --   --  0.71   < > = values in this interval not displayed.    Estimated Creatinine Clearance: 80.4 mL/min (by C-G formula based on SCr of 0.71 mg/dL).  Assessment: Patient is a 58yo male that is s/p thrombectomy. Patient is to be restarted on full dose Heparin without an initial bolus.  Goal of Therapy:  Heparin level 0.3-0.7 units/ml Monitor platelets by anticoagulation protocol: Yes   Plan:  Will restart Heparin at 800 units/hr. Will check a Heparin level in 6 hours.  Clovia Cuff, PharmD, BCPS 03/20/2018 2:51 PM

## 2018-03-20 NOTE — Op Note (Addendum)
[facility] Percutaneous Study/Intervention Procedural Note   Date of Surgery: 03/20/2018  Surgeon(s): Leotis Pain  Assistants:none  Pre-operative Diagnosis: PAD with rest pain bilaterally, occlusion of aortabifemoral bypass with overnight thrombolytic therapy performed  Post-operative diagnosis: Same  Procedure(s) Performed: 1. Aortogram and bilateral iliofemoral angiograms 2. Catheter placement into aorta from bilateral femoral approaches 3. Mechanical thrombectomy using the penumbra cat 6 device to the proximal portion of the right limb of the aortobifemoral bypass, the proximal bypass anastomosis, and the aorta 4. Kissing balloon expandable stent placements to the proximal portions of both limbs of the aortobifemoral bypass, across the proximal bypass anastomosis and into the aorta just below the renal arteries using two 8 mm diameter by 58 mm length lifestream stents 5. StarClose closure device bilateral femoral arteries  EBL: 20 cc  Contrast: 70 cc  Fluoro Time: 4.4 min  Moderate Conscious Sedation time: approximately 45 minutes using 3 mg of Versed and 150 Mcg of Fentanyl  Indications: Patient is a 58 y.o. male with rest pain in both legs and occlusion of his aortobifemoral bypass.  He has had overnight thrombolytic therapy to try to restore patency of the bypass graft. The patient is brought in for angiography for further evaluation and potential treatment. Risks and benefits are discussed and informed consent is obtained  Procedure: The patient was identified and appropriate procedural time out was performed. The patient was then placed supine on the table and prepped and draped in the usual sterile fashion. Moderate conscious sedation was administered throughout the procedure with a face to face encounter with the patient throughout and with my supervision of the RN  administering medicines and monitoring the patients vital signs and mental status throughout from the start of the procedure until the patient was taken to the recovery room. The existing lytic catheters were removed over a Magic torque wires parked in the aorta bilaterally.  A pigtail catheter was then placed into the aorta at the level of the renal arteries and imaging was performed.  This demonstrated some thrombus in the aorta at the proximal bypass anastomosis and there appeared to be a small amount of thrombus at the origin of the right limb of the aortobifemoral bypass graft.  No thrombus was seen in the left limb of the bypass graft.  Associated with the thrombus at the proximal bypass anastomosis appeared to be a greater than 60% stenosis.  Imaging performed through the sheath bilaterally to evaluate the distal portions of the bypass and the femoral bifurcations. The left limb of the bypass was now open distally with patency of the common femoral artery and profunda femoris artery. The distal portion of the right limb of the bypass was now open with patency of the common femoral artery and profunda femoris artery and occlusion of the superficial femoral artery at its origin. The superficial femoral artery was occluded about 1 cm beyond its origin.  At this point, I used the penumbra cat 6 device from the right side and perform several passes with the mechanical thrombectomy device in the proximal portion of the right limb of the aortobifemoral bypass, and the proximal bypass anastomosis area, and the aorta.  Following multiple passes, there appeared to be resolution of the thrombus in the proximal portion of the right limb of the aortobifemoral bypass, but there remained greater than 60% stenosis of the proximal bypass anastomosis.  It was unclear if there was thrombus in the lumen or if this was in the native aorta.  Either way, this required treatment.  I upsized to a 7 [place] sheath bilaterally.  8 mm  diameter by 58 mm length lifestream balloon expandable stents were selected and used on both sides.  The proximal portions of the stents were deployed about 5 mm below the left renal artery which was lowest in the native aorta.  They came back across the proximal bypass anastomosis and back into the right and left limbs of the aortobifemoral bypass about 2 cm.  The balloons were inflated to 10 atm.  Completion imaging following this showed resolution of the stenosis with no residual stenosis and widely patent stents and aortobifemoral bypass limbs.  At this point, flow had clearly been restored. I elected to terminate the procedure. The sheath was removed and StarClose closure device was deployed in the left femoral artery with excellent hemostatic result.  On the right, the sheath was removed and Star close closure device was deployed in the right femoral artery with excellent hemostatic result as well.  The patient was taken to the recovery room in stable condition having tolerated the procedure well.  Findings:  Aortogram: This demonstrated some thrombus in the aorta at the proximal bypass anastomosis and there appeared to be a small amount of thrombus at the origin of the right limb of the aortobifemoral bypass graft.  No thrombus was seen in the left limb of the bypass graft.  Associated with the thrombus at the proximal bypass anastomosis appeared to be a greater than 60% stenosis. Left Lower Extremity:Left limb of the bypass was now open distally with patency of the common femoral artery and profunda femoris artery.  The superficial femoral artery was occluded about 1 cm beyond its origin  Right lower extremity: Distal portion of the right limb of the bypass was now open with patency of the common femoral artery and profunda femoris artery and occlusion of the superficial femoral artery at its origin.   Disposition: Patient was taken to the recovery room in stable condition  having tolerated the procedure well.  Complications: None  Leotis Pain 03/20/2018 1:25 PM     This note was created with Dragon Medical transcription system. Any errors in dictation are purely unintentional.

## 2018-03-20 NOTE — Progress Notes (Signed)
Nutrition Brief Note  RD received consult for assessment of nutrition requirements/status  Wt Readings from Last 15 Encounters:  03/19/18 55.1 kg  12/09/17 54 kg  11/28/17 56.7 kg  11/13/17 56.5 kg  11/06/17 54.4 kg  10/29/17 54.3 kg  10/14/17 56.7 kg  10/01/17 56.1 kg  09/18/17 59 kg  10/08/16 30.8 kg   58 year old male with PMHx of arthritis, HTN, PVD, EtOH abuse, hx aortobifemoral bypass in 10/2017, current smoker who was admitted with bilateral lower extremity pain found to have total occlusion of infrarenal abdominal aorta s/p aortogram and ileofemoral angiogram with catheter-directed thrombolytic therapy on 2/19. Plan for repeat aortogram and angiogram today.  Met with patient at bedside. He reports he has a very good appetite and intake that is unchanged from baseline. He eats 2-3 meals per day and finishes 100% of his meals. Reports intake has not declined from usual. He reports typically eating a meat with sides at meals. He endorses choosing adequate portion sizes. He reports his weight is stable at around 120-125 lbs and denies any unintentional weight loss. Patient has a small body habitus but there are no significant muscle or fat depletions found on nutrition-focused physical exam. Patient does not meet criteria for malnutrition. Skin is intact. Encouraged ongoing adequate intake of calories and protein from meals/beverages. Encouraged patient to continue vitamin/mineral supplementation ordered by MD. Patient with no nutrition questions or concerns at this time.  Body mass index is 19.03 kg/m. Patient meets criteria for normal weight based on current BMI.   Current diet order is NPO for procedure. Patient reports he was eating 100% of meals PTA and is confident he will continue to eat well once diet advanced after procedure. Labs and medications reviewed.   No nutrition interventions warranted at this time. If nutrition issues arise, please consult RD.   Willey Blade, MS,  [place], LDN Office: [phone] Pager: [phone] After Hours/Weekend Pager: [phone]

## 2018-03-20 NOTE — Progress Notes (Signed)
Report completed at 0715. Bedside patient assessment.  Pt bleeding from left groin site.  Harold Hedge, RN to hold pressure for 20 minutes.  Notified Dr Wyn Quaker.  He ordered to turn off bilateral sheath sites infusing Alteplase and heparin - see MAR.

## 2018-03-20 NOTE — Progress Notes (Signed)
ANTICOAGULATION CONSULT NOTE - Follow Up Consult  Pharmacy Consult for Heparin Indication: S/P Thrombectomy  No Known Allergies  Patient Measurements: Height: 5\' 7"  (170.2 cm) Weight: 121 lb 7.6 oz (55.1 kg) IBW/kg (Calculated) : 66.1 Heparin Dosing Weight: 55.1 kg  Vital Signs: Temp: 98.9 F (37.2 C) (02/20 2000) Temp Source: Oral (02/20 2000) BP: 98/60 (02/20 2200) Pulse Rate: 82 (02/20 2200)  Labs: Recent Labs    03/18/18 1421 03/18/18 1635  03/19/18 0520  03/19/18 1835 03/20/18 0047 03/20/18 0449 03/20/18 2057  HGB 15.9  --   --  15.3  --  14.6 13.5 12.6*  --   HCT 46.3  --   --  44.4  --  42.2 38.9* 36.6*  --   PLT 215  --   --  192  --  157 133* 152  --   APTT  --  126*  --   --   --   --   --   --   --   LABPROT 12.7  --   --  12.2  --   --   --   --   --   INR 0.96  --   --  0.91  --   --   --   --   --   HEPARINUNFRC  --   --    < > 1.23*   < > 0.33 0.42 0.43 0.35  CREATININE 0.72  --   --  0.57*  --   --   --  0.71  --    < > = values in this interval not displayed.    Estimated Creatinine Clearance: 80.4 mL/min (by C-G formula based on SCr of 0.71 mg/dL).  Assessment: Patient is a 59yo male that is s/p thrombectomy. Patient is to be restarted on full dose Heparin without an initial bolus.  Goal of Therapy:  Heparin level 0.3-0.7 units/ml Monitor platelets by anticoagulation protocol: Yes   Plan:  Will restart Heparin at 800 units/hr. Will check a Heparin level in 6 hours.  2/20 :  HL @ 2100 = 0.35. Will continue pt on current rate and recheck HL in 6 hrs on 2/21 @ 0300.   Fernanda Twaddell D 03/20/2018 10:08 PM

## 2018-03-20 NOTE — Consult Note (Signed)
ANTICOAGULATION CONSULT NOTE - Initial Consult  Pharmacy Consult for heparin drip management Indication: arterial occlusion / aortic bypass graft  No Known Allergies  Patient Measurements: Height: 5\' 7"  (170.2 cm) Weight: 121 lb 7.6 oz (55.1 kg) IBW/kg (Calculated) : 66.1  Vital Signs: Temp: 99.3 F (37.4 C) (02/20 0000) Temp Source: Oral (02/20 0000) BP: 132/72 (02/20 0000) Pulse Rate: 103 (02/20 0000)  Labs: Recent Labs    03/18/18 1421 03/18/18 1635  03/19/18 0520 03/19/18 0904 03/19/18 1835 03/20/18 0047  HGB 15.9  --   --  15.3  --  14.6 13.5  HCT 46.3  --   --  44.4  --  42.2 38.9*  PLT 215  --   --  192  --  157 133*  APTT  --  126*  --   --   --   --   --   LABPROT 12.7  --   --  12.2  --   --   --   INR 0.96  --   --  0.91  --   --   --   HEPARINUNFRC  --   --    < > 1.23* 0.53 0.33 0.42  CREATININE 0.72  --   --  0.57*  --   --   --    < > = values in this interval not displayed.    Estimated Creatinine Clearance: 80.4 mL/min (A) (by C-G formula based on SCr of 0.57 mg/dL (L)).   Medical History: Past Medical History:  Diagnosis Date  . Arthritis   . Hypertension    no meds  . Peripheral vascular disease (HCC)    pad    Assessment: 57 YOM comes into the ED via EMS from home with c/o BL LE pain with numbness and weakness since Sunday night. He states he has a hx of abd aortic blockage with bypass in October 2019, states this feels similar. CT not completed as of this note. He is on no chronic anticoagulation PTA. Baseline labs have been ordered but not yet resulted.  Goal of Therapy:  Vascular has set therapeutic HL goal in this patient to 0.2-0.5 Monitor platelets by anticoagulation protocol: Yes   Plan:  Post procedure - Vascular set therapeutic goal @ 0.2-0.5 - adjusted rate to 800 units/hr - Vascular set level checks every 6 hours.  2/19 @ 1835 HL 0.33. Will continue current infusion rate of 800 units/hr  (400units/hr in each sheath). Next  heparin level @ 2300.   2/20 0100 heparin level 0.42. Continue current regimen. Recheck next labs per vascular.  Erich Montane, PharmD, BCPS Clinical Pharmacist 03/20/2018 2:03 AM

## 2018-03-20 NOTE — Progress Notes (Signed)
Unable to perform IS @ this time. Keeps falling asleep

## 2018-03-20 NOTE — Progress Notes (Signed)
At 0620 Dr. Wyn Quaker notified of pt. bleeding on the left groin site;  pressure held for . A.M. lab result reported to MD @ this time . Heparin & Ateplase gtt clarified. Will stop both gtts @ 12;00 before pt goes to vascular Lab per MD order. Will continue to monitor pt closely.

## 2018-03-21 DIAGNOSIS — Z9889 Other specified postprocedural states: Secondary | ICD-10-CM

## 2018-03-21 LAB — BASIC METABOLIC PANEL
Anion gap: 5 (ref 5–15)
BUN: 10 mg/dL (ref 6–20)
CO2: 25 mmol/L (ref 22–32)
Calcium: 7.8 mg/dL — ABNORMAL LOW (ref 8.9–10.3)
Chloride: 106 mmol/L (ref 98–111)
Creatinine, Ser: 0.71 mg/dL (ref 0.61–1.24)
GFR calc Af Amer: >60 mL/min (ref >60)
GFR calc non Af Amer: >60 mL/min (ref >60)
GLUCOSE: 98 mg/dL (ref 70–99)
Potassium: 3.3 mmol/L — ABNORMAL LOW (ref 3.5–5.1)
Sodium: 136 mmol/L (ref 135–145)

## 2018-03-21 LAB — CBC
HCT: 28.4 % — ABNORMAL LOW (ref 39.0–52.0)
HEMOGLOBIN: 9.7 g/dL — AB (ref 13.0–17.0)
MCH: 35.4 pg — AB (ref 26.0–34.0)
MCHC: 34.2 g/dL (ref 30.0–36.0)
MCV: 103.6 fL — ABNORMAL HIGH (ref 80.0–100.0)
Platelets: 121 10*3/uL — ABNORMAL LOW (ref 150–400)
RBC: 2.74 MIL/uL — ABNORMAL LOW (ref 4.22–5.81)
RDW: 13.2 % (ref 11.5–15.5)
WBC: 6 10*3/uL (ref 4.0–10.5)
nRBC: 0 % (ref 0.0–0.2)

## 2018-03-21 LAB — HEPARIN LEVEL (UNFRACTIONATED): Heparin Unfractionated: 0.44 IU/mL (ref 0.30–0.70)

## 2018-03-21 MED ORDER — KETOROLAC TROMETHAMINE 30 MG/ML IJ SOLN
30.0000 mg | Freq: Four times a day (QID) | INTRAMUSCULAR | Status: DC
  Administered 2018-03-21 – 2018-03-22 (×4): 30 mg via INTRAVENOUS
  Filled 2018-03-21 (×4): qty 1

## 2018-03-21 NOTE — Progress Notes (Signed)
Patient has ambulated in unit.  Placed order for transfer to MedSurg floor.  Transfer, PCCM will sign off. Please call if we can be of further assistance   Billy Fischer, MD PCCM service Mobile [phone] Pager [phone] 03/21/2018 4:38 PM

## 2018-03-21 NOTE — Progress Notes (Addendum)
[facility] at [facility]   PATIENT NAME: Kyle Gordon    MR#:  [id number]  DATE OF BIRTH:  1960/08/28  SUBJECTIVE: Patient admitted for bilateral leg pain, seen in the emergency room, he complains of more pain in the left leg.  CHIEF COMPLAINT:   Patient feeling much better, vascular surgery input greatly appreciated  REVIEW OF SYSTEMS:   ROS CONSTITUTIONAL: No fever, fatigue or weakness.  EYES: No blurred or double vision.  EARS, NOSE, AND THROAT: No tinnitus or ear pain.  RESPIRATORY: No cough, shortness of breath, wheezing or hemoptysis.  CARDIOVASCULAR: No chest pain, orthopnea, edema.  GASTROINTESTINAL: No nausea, vomiting, diarrhea or abdominal pain.  GENITOURINARY: No dysuria, hematuria.  ENDOCRINE: No polyuria, nocturia,  HEMATOLOGY: No anemia, easy bruising or bleeding SKIN: No rash or lesion. MUSCULOSKELETAL ; bilateral leg pain. NEUROLOGIC: No tingling, numbness, weakness.  PSYCHIATRY: No anxiety or depression.   DRUG ALLERGIES:  No Known Allergies  VITALS:  Blood pressure 115/61, pulse 77, temperature 98.1 F (36.7 C), temperature source Oral, resp. rate 18, height 5\' 7"  (1.702 m), weight 55.1 kg, SpO2 98 %.  PHYSICAL EXAMINATION:  GENERAL:  58 y.o.-year-old patient lying in the bed with no acute distress.  EYES: Pupils equal, round, reactive to light and accommodation. No scleral icterus. Extraocular muscles intact.  HEENT: Head atraumatic, normocephalic. Oropharynx and nasopharynx clear.  NECK:  Supple, no jugular venous distention. No thyroid enlargement, no tenderness.  LUNGS: Normal breath sounds bilaterally, no wheezing, rales,rhonchi or crepitation. No use of accessory muscles of respiration.  CARDIOVASCULAR: S1, S2 normal. No murmurs, rubs, or gallops.  ABDOMEN: Soft, nontender, nondistended. Bowel sounds present. No organomegaly or mass.  EXTREMITIES: Patient has palpable radial, ulnar, but nonpalpable popliteal,  DP, PT on both sides. NEUROLOGIC: Cranial nerves II through XII are intact. Muscle strength 5/5 in all extremities. Sensation intact. Gait not checked.  PSYCHIATRIC: The patient is alert and oriented x 3.  SKIN: No obvious rash, lesion, or ulcer.    LABORATORY PANEL:   CBC Recent Labs  Lab 03/21/18 0445  WBC 6.0  HGB 9.7*  HCT 28.4*  PLT 121*   ------------------------------------------------------------------------------------------------------------------  Chemistries  Recent Labs  Lab 03/18/18 1421 03/19/18 0520  03/21/18 0445  NA 137 135   < > 136  K 3.6 3.7   < > 3.3*  CL 104 103   < > 106  CO2 22 25   < > 25  GLUCOSE 198* 132*   < > 98  BUN 7 5*   < > 10  CREATININE 0.72 0.57*   < > 0.71  CALCIUM 9.6 9.0   < > 7.8*  MG  --  1.9  --   --   AST 30  --   --   --   ALT 16  --   --   --   ALKPHOS 99  --   --   --   BILITOT 1.1  --   --   --    < > = values in this interval not displayed.   ------------------------------------------------------------------------------------------------------------------  Cardiac Enzymes No results for input(s): TROPONINI in the last 168 hours. ------------------------------------------------------------------------------------------------------------------  RADIOLOGY:  No results found.  EKG:   Orders placed or performed during the hospital encounter of 03/18/18  . EKG 12-Lead  . EKG 12-Lead    ASSESSMENT AND PLAN:  58 year old male patient with history of hypertension, tobacco abuse, PAD, previous history of aortofemoral bypass comes in because  of bilateral leg pain, found to have occluded aorto bifemoral bypass on CT angios.  *acute bilateral limb ischemia, occlusion of bifemoral bypass Treated initially on heparin drip March 19, 2018 - status post aortogram/iliofemoral angiogram with catheter directed TPA by vascular surgery/Dr. Wyn Gordon March 20, 2018-status post repeat aortogram/bilateral iliofemoral aortograms with  mechanical thrombectomy, kissing balloon expandable stent placement within proximal portions of the aortobifem bypass-complicated by small left groin hematoma Continue aspirin, Eliquis, Lipitor, adult pain protocol  *Chronic hypertension Controlled on current regiment  *History of PAD patient told me that he stopped taking medicines aspirin, Plavix, statins 1 month ago thought that he does not need to take anymore.  Advised the patient that he needs to continue indefinitely   Disposition home on tomorrow barring any complications  All the records are reviewed and case discussed with Care Management/Social Workerr. Management plans discussed with the patient, family and they are in agreement.  CODE STATUS: Full code  TOTAL TIME TAKING CARE OF THIS PATIENT: 35 minutes.   POSSIBLE D/C IN 1-2 DAYS, DEPENDING ON CLINICAL CONDITION. More than 50% time spent in counseling, coordination of care  Kyle Gordon M.D on 03/21/2018 at 1:23 PM  Between 7am to 6pm - Pager - [phone]  After 6pm go to [email or web address] - password [email or web address]  [facility]  Office  [phone]  CC: Primary care physician; Patient, No Pcp Per   Note: This dictation was prepared with Dragon dictation along with smaller phrase technology. Any transcriptional errors that result from this process are unintentional.

## 2018-03-21 NOTE — Progress Notes (Signed)
Mother Bryson Ha called to inform about room transfer.

## 2018-03-21 NOTE — Progress Notes (Signed)
Report called to [facility] and patient transferred to room 218 on 2C. NT and RN accompanied patient to new room.

## 2018-03-21 NOTE — Progress Notes (Signed)
[facility] Daily Progress Note   Subjective: 1 Day Post-Op:   1. Aortogram and bilateral iliofemoral angiograms 2. Catheter placement into aorta from bilateral femoral approaches 3. Mechanical thrombectomy using the penumbra cat 6 device to the proximal portion of  the right limb of the aortobifemoral bypass, the proximal bypass anastomosis, and the  aorta 4. Kissing balloon expandable stent placements to the proximal portions of both limbs of  the aortobifemoral bypass, across the proximal bypass anastomosis and into the aorta  just below the renal arteries using two 8 mm diameter by 58 mm length lifestream stents 5. StarClose closure device bilateral femoral arteries  2 Day Post-Op:  1. Ultrasound guidance for vascular access bilateral femoral artery 2. Catheter placement into the aorta from bilateral femoral approach 3. Aortogram and ileofemoral angiogram 4. Catheter directed thrombolytic therapy to both limbs of the aortobifemoral bypass in the  aorta proximal to the bypass with 4 mg of TPA through both catheters 5. Placement of infusion catheters into both limbs of the aortobifemoral bypass up to the  aorta at the level of the renals for continuous infusion of TPA             6.  Right femoral triple-lumen catheter placement with ultrasound and fluoroscopic  guidance  Patient with some bilateral lower extremity pain however markedly improved.   Objective: Vitals:   03/21/18 0854 03/21/18 0900 03/21/18 1000 03/21/18 1100  BP:  102/61 100/61 123/64  Pulse:  73 79 72  Resp:  10 14 17   Temp:      TempSrc:      SpO2: 97% 100% 100% 100%  Weight:      Height:        Intake/Output Summary (Last 24 hours) at 03/21/2018 1247 Last data filed at 03/21/2018 1045 Gross per 24 hour  Intake 806.97 ml  Output 475 ml  Net 331.97 ml   Physical Exam: A&Ox3,  NAD CV: RRR Pulmonary: CTA Bilaterally Abdomen: Soft, Nontender, Nondistended Right Groin: PAD removed. Central line in place. No drainage or swelling noted. Left Groin: PAD removed. Small hematoma noted. Some ecchymosis noted. Minimal drainage from access site Vascular:  Right Lower Extremity: Thigh soft, calf soft. Extremity is warm distally to toes. Hard to palpate pedal pulses but good capillary refill  Left Lower Extremity: Thigh soft, calf soft. Extremity is warm distally to toes. Hard to palpate pedal pulses but good capillary refill   Laboratory: CBC    Component Value Date/Time   WBC 6.0 03/21/2018 0445   HGB 9.7 (L) 03/21/2018 0445   HCT 28.4 (L) 03/21/2018 0445   PLT 121 (L) 03/21/2018 0445   BMET    Component Value Date/Time   NA 136 03/21/2018 0445   K 3.3 (L) 03/21/2018 0445   CL 106 03/21/2018 0445   CO2 25 03/21/2018 0445   GLUCOSE 98 03/21/2018 0445   BUN 10 03/21/2018 0445   CREATININE 0.71 03/21/2018 0445   CALCIUM 7.8 (L) 03/21/2018 0445   GFRNONAA >60 03/21/2018 0445   GFRAA >60 03/21/2018 0445   Assessment/Planning: The patient is a 58 year old male admitted with occlusion on his aorto-bifemoral bypass s/p successful revascularization  1) Small hematoma to left groin. Will monitor 2) Heparin stopped and transitioned to ASA Eliquis as patient has failed ASA / Plavix 3) Toradol added to help control pain and decrease use of narcotics 4) Bed rest discontinued - encouraged ambulation. Ordered PT 5) Would expect discharge home tomorrow if  patient is medical stable, physical exam is unchanged and is ambulating independently.   Discussed with Dr. Wallis Mart Erroll Wilbourne PA-C 03/21/2018 12:47 PM

## 2018-03-21 NOTE — Evaluation (Signed)
Physical Therapy Evaluation Patient Details Name: Kyle Gordon MRN: [id number] DOB: [birth year] Today's Date: 03/21/2018   History of Present Illness  58 y/o male here with b/l LE pain, h/o aotrtofemoral bypasses, is now s/p revascularization (angio, stents, caths, thrombect, etc) on 2/20.    Clinical Impression  Pt feeling much better post revascularization and reports no pain while at rest in bed.  He is able to ambulate ~200 ft w/o AD and though he did have mild limp with inability to fully put heel down on L most of the time, he did ultimately have no safety (or other issues) during "prolonged" bout of ambulation.  His HR did initially increase from 80s to 110s, but then stayed steady (~115 bpm) for most of the effort w/o any subjective or overt c/o fatigue or other issues.  Discussed appropriate stair negotiation and gentle stretches for the calf, no further PT needed in house or once he is discharge.  Will complete PT orders at this time.     Follow Up Recommendations No PT follow up    Equipment Recommendations  None recommended by PT    Recommendations for Other Services       Precautions / Restrictions Precautions Precautions: None Restrictions Weight Bearing Restrictions: No Other Position/Activity Restrictions: cleared by vascular to be up and walking      Mobility  Bed Mobility Overal bed mobility: Independent             General bed mobility comments: Pt showed good core strength to long sit up in bed, no groin, etc pain, not issues getting to EOB  Transfers Overall transfer level: Independent Equipment used: None             General transfer comment: Pt easily attains standing at EOB without assist, no balance/safety issues   Ambulation/Gait Ambulation/Gait assistance: Modified independent (Device/Increase time) Gait Distance (Feet): 200 Feet Assistive device: None       General Gait Details: Pt able to safely and confidently ambulate around  the unit, he did have limp on L needing some time to get heel fully down to the ground secondary to tightness in calf.  Pt was ultimatley able to attain heel flat near the end of ambulation with cuing  Stairs            Wheelchair Mobility    Modified Rankin (Stroke Patients Only)       Balance Overall balance assessment: Independent                                           Pertinent Vitals/Pain Pain Assessment: No/denies pain(does have some L calf pain/tightness during ambulation)    Home Living Family/patient expects to be discharged to:: Private residence Living Arrangements: Spouse/significant other Available Help at Discharge: Family Type of Home: House Home Access: Stairs to enter Entrance Stairs-Rails: None Secretary/administrator of Steps: 2 Home Layout: One level Home Equipment: None      Prior Function Level of Independence: Independent         Comments: 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      Hand Dominance        Extremity/Trunk Assessment   Upper Extremity Assessment Upper Extremity Assessment: Overall WFL for tasks assessed    Lower Extremity Assessment Lower Extremity Assessment: Overall WFL for tasks assessed(did lack  full ankle DF on L)       Communication   Communication: No difficulties  Cognition Arousal/Alertness: Awake/alert Behavior During Therapy: WFL for tasks assessed/performed Overall Cognitive Status: Within Functional Limits for tasks assessed                                        General Comments      Exercises     Assessment/Plan    PT Assessment Patent does not need any further PT services  PT Problem List         PT Treatment Interventions      PT Goals (Current goals can be found in the Care Plan section)  Acute Rehab PT Goals Patient Stated Goal: go home tomorrow PT Goal Formulation: All assessment and education  complete, DC therapy    Frequency     Barriers to discharge        Co-evaluation               AM-PAC PT "6 Clicks" Mobility  Outcome Measure Help needed turning from your back to your side while in a flat bed without using bedrails?: None Help needed moving from lying on your back to sitting on the side of a flat bed without using bedrails?: None Help needed moving to and from a bed to a chair (including a wheelchair)?: None Help needed standing up from a chair using your arms (e.g., wheelchair or bedside chair)?: None Help needed to walk in hospital room?: None Help needed climbing 3-5 steps with a railing? : None 6 Click Score: 24    End of Session Equipment Utilized During Treatment: Gait belt Activity Tolerance: Patient tolerated treatment well Patient left: in chair;with nursing/sitter in room Nurse Communication: Mobility status PT Visit Diagnosis: Difficulty in walking, not elsewhere classified (R26.2);Pain Pain - Right/Left: Left Pain - part of body: Leg    Time: 1350-1413 PT Time Calculation (min) (ACUTE ONLY): 23 min   Charges:   PT Evaluation $PT Eval Low Complexity: 1 Low          Malachi Pro, DPT 03/21/2018, 2:34 PM

## 2018-03-21 NOTE — Progress Notes (Signed)
0400 assessment - bilateral groin PAD leaking/lost seal.  PAD replaced with assistance.  Pressure held to ensure no active bleeding prior to replacement of PAD.  Right groin Level 1 and Left groin not bleeding but palpable hematoma within previously traced parameters. Notified NP,  Am labs obtained.

## 2018-03-21 NOTE — Progress Notes (Signed)
ANTICOAGULATION CONSULT NOTE - Follow Up Consult  Pharmacy Consult for Heparin Indication: S/P Thrombectomy  No Known Allergies  Patient Measurements: Height: 5\' 7"  (170.2 cm) Weight: 121 lb 7.6 oz (55.1 kg) IBW/kg (Calculated) : 66.1 Heparin Dosing Weight: 55.1 kg  Vital Signs: Temp: 98 F (36.7 C) (02/20 2330) Temp Source: Axillary (02/20 2330) BP: 101/58 (02/21 0300) Pulse Rate: 76 (02/21 0300)  Labs: Recent Labs    03/18/18 1421 03/18/18 1635  03/19/18 0520  03/19/18 1835 03/20/18 0047 03/20/18 0449 03/20/18 2057 03/21/18 0315  HGB 15.9  --   --  15.3  --  14.6 13.5 12.6*  --   --   HCT 46.3  --   --  44.4  --  42.2 38.9* 36.6*  --   --   PLT 215  --   --  192  --  157 133* 152  --   --   APTT  --  126*  --   --   --   --   --   --   --   --   LABPROT 12.7  --   --  12.2  --   --   --   --   --   --   INR 0.96  --   --  0.91  --   --   --   --   --   --   HEPARINUNFRC  --   --    < > 1.23*   < > 0.33 0.42 0.43 0.35 0.44  CREATININE 0.72  --   --  0.57*  --   --   --  0.71  --   --    < > = values in this interval not displayed.    Estimated Creatinine Clearance: 80.4 mL/min (by C-G formula based on SCr of 0.71 mg/dL).  Assessment: Patient is a 58yo male that is s/p thrombectomy. Patient is to be restarted on full dose Heparin without an initial bolus.  Goal of Therapy:  Heparin level 0.3-0.7 units/ml Monitor platelets by anticoagulation protocol: Yes   Plan:  Will restart Heparin at 800 units/hr. Will check a Heparin level in 6 hours.  2/20 :  HL @ 2100 = 0.35. Will continue pt on current rate and recheck HL in 6 hrs on 2/21 @ 0300.   2/21 0300 heparin level 0.44. Continue current regimen. Recheck heparin level and CBC with tomorrow AM labs.  Fulton Reek, PharmD, BCPS  03/21/18 4:20 AM

## 2018-03-22 LAB — CBC
HCT: 25.9 % — ABNORMAL LOW (ref 39.0–52.0)
Hemoglobin: 8.9 g/dL — ABNORMAL LOW (ref 13.0–17.0)
MCH: 34.9 pg — ABNORMAL HIGH (ref 26.0–34.0)
MCHC: 34.4 g/dL (ref 30.0–36.0)
MCV: 101.6 fL — ABNORMAL HIGH (ref 80.0–100.0)
Platelets: 125 K/uL — ABNORMAL LOW (ref 150–400)
RBC: 2.55 MIL/uL — ABNORMAL LOW (ref 4.22–5.81)
RDW: 13 % (ref 11.5–15.5)
WBC: 5.2 K/uL (ref 4.0–10.5)
nRBC: 0 % (ref 0.0–0.2)

## 2018-03-22 LAB — MAGNESIUM: Magnesium: 2 mg/dL (ref 1.7–2.4)

## 2018-03-22 LAB — BASIC METABOLIC PANEL WITH GFR
Anion gap: 5 (ref 5–15)
BUN: 14 mg/dL (ref 6–20)
CO2: 26 mmol/L (ref 22–32)
Calcium: 8 mg/dL — ABNORMAL LOW (ref 8.9–10.3)
Chloride: 106 mmol/L (ref 98–111)
Creatinine, Ser: 0.7 mg/dL (ref 0.61–1.24)
GFR calc Af Amer: >60 mL/min (ref >60)
GFR calc non Af Amer: >60 mL/min (ref >60)
Glucose, Bld: 96 mg/dL (ref 70–99)
Potassium: 3.2 mmol/L — ABNORMAL LOW (ref 3.5–5.1)
Sodium: 137 mmol/L (ref 135–145)

## 2018-03-22 MED ORDER — POTASSIUM CHLORIDE CRYS ER 20 MEQ PO TBCR
40.0000 meq | EXTENDED_RELEASE_TABLET | Freq: Once | ORAL | Status: AC
  Administered 2018-03-22: 40 meq via ORAL
  Filled 2018-03-22: qty 2

## 2018-03-22 MED ORDER — POTASSIUM CHLORIDE CRYS ER 20 MEQ PO TBCR: 40.0000 meq | EXTENDED_RELEASE_TABLET | Freq: Every day | ORAL | 0 refills | Status: AC

## 2018-03-22 MED ORDER — ATORVASTATIN CALCIUM 10 MG PO TABS: 10.0000 mg | ORAL_TABLET | Freq: Every day | ORAL | 11 refills | Status: DC

## 2018-03-22 MED ORDER — ALBUTEROL SULFATE (2.5 MG/3ML) 0.083% IN NEBU: 2.5000 mg | INHALATION_SOLUTION | Freq: Four times a day (QID) | RESPIRATORY_TRACT | Status: DC | PRN

## 2018-03-22 MED ORDER — NICOTINE 14 MG/24HR TD PT24: 14.0000 mg | MEDICATED_PATCH | Freq: Every day | TRANSDERMAL | 0 refills | Status: AC

## 2018-03-22 MED ORDER — ASPIRIN 81 MG PO TBEC: 81.0000 mg | DELAYED_RELEASE_TABLET | Freq: Every day | ORAL | 3 refills | Status: AC

## 2018-03-22 MED ORDER — APIXABAN 5 MG PO TABS: 5.0000 mg | ORAL_TABLET | Freq: Two times a day (BID) | ORAL | 2 refills | Status: DC

## 2018-03-22 NOTE — Progress Notes (Signed)
Patient cleared for discharge.      Education complete. AVS printed. Discharge instructions given. All questions answered for patient clarification.  Prescriptions given, pharmacy verified. Coupon for Eliquis given.  IVs removed.  Discharged to home via POV.....awaiting ride home.

## 2018-03-22 NOTE — Discharge Summary (Addendum)
[facility] - [place] at [facility], [place] y.o., DOB [birth year], MRN [id number]. Admission date: 03/18/2018 Discharge Date 03/22/2018 Primary MD Patient, No Pcp Per Admitting Physician Bertrum Sol, MD  Admission Diagnosis  Bilateral leg pain [M79.604, M79.605] Thrombosis of aortic bifurcation bypass graft, initial encounter [facility]) [Q49.201E] Aortic occlusion [facility]) [I74.10]  Discharge Diagnosis   Active Problems: Acute bilateral limb ischemia, occlusion of the bifemoral bypass Essential hypertension Peripheral arterial disease Osteoarthritis      Hospital CourseFranklin Gordon  is a 58 y.o. male with a known history history per below which includes peripheral arterial disease status post aortobifem bypass, presenting with 2-day history of bilateral leg pain, numbness, tingling, continues to worsen, no better with medication, worse with movement, in the emergency room patient was noted to be tachycardic, hypertensive, glucose 198, white count 13.4, CT abdomen noted for infrarenal abdominal aortic occlusion, vascular surgery/Dr. dew recommended patient be admitted.  Patient was started on IV heparin and was seen by vascular surgery next day.   Patient underwent following procedure:  1. Ultrasound guidance for vascular access bilateral femoral artery 2. Catheter placement into the aorta from bilateral femoral approach 3. Aortogram and ileofemoral angiogram 4. Catheter directed thrombolytic therapy to both limbs of the aortobifemoral bypass in the aorta proximal to the bypass with 4 mg of TPA through both catheters 5. Placement of infusion catheters into both limbs of the aortobifemoral bypass up to the aorta at the level of the renals for continuous infusion of TPA             6.  Right femoral triple-lumen catheter placement with ultrasound and fluoroscopic guidance    He has been noncompliant  with antiplatelet medications.  Patient was recommended to be started on Eliquis and aspirin and statin by vascular surgery.  I have strongly recommended patient to comply with his medications.        Consults  vascular surgery  Significant Tests:  See full reports for all details     Ct Angio Aortobifemoral W And/or Wo Contrast  Result Date: 03/18/2018 CLINICAL DATA:  59 year old with history of an aortobifemoral bypass on 11/13/2017. Patient presents with bilateral leg pain and numbness. EXAM: CT ANGIOGRAPHY OF ABDOMINAL AORTA WITH ILIOFEMORAL RUNOFF TECHNIQUE: Multidetector CT imaging of the abdomen, pelvis and lower extremities was performed using the standard protocol during bolus administration of intravenous contrast. Multiplanar CT image reconstructions and MIPs were obtained to evaluate the vascular anatomy. CONTRAST:  OMNIPAQUE IOHEXOL 350 MG/ML SOLN COMPARISON:  None. FINDINGS: VASCULAR Aorta: Occlusion of the infrarenal abdominal aorta. The aortobifemoral bypass is occluded bilaterally. Native distal abdominal aorta is occluded. Celiac: Mild atherosclerotic disease in the proximal celiac trunk without significant stenosis. Left gastric artery, splenic artery and common hepatic artery are patent. SMA: Atherosclerotic disease in the SMA. SMA is patent. No significant stenosis at the origin. Renals: Moderate stenosis in the proximal right renal artery. Right renal artery is patent. At least mild stenosis in the proximal left renal artery. IMA: Reconstitution of the IMA branches.  Origin of IMA is occluded. RIGHT Lower Extremity Inflow: Native right external and common iliac arteries are patent. Right femoral bypass graft is occluded. Small amount of flow in internal iliac artery branches but difficult to evaluate due to calcifications. Outflow: Reconstitution of the right common femoral artery through collateral flow. Stenosis and possible thrombus in the right common femoral arter.y  right profunda femoral arteries are patent. Proximal and mid right SFA is  occluded. Reconstitution of the very distal SFA. Popliteal artery is patent. Runoff: Tibioperoneal trunk is heavily calcified. Disease and stenosis at the origin of the anterior tibial artery. Peroneal artery and anterior tibial artery occluded in the mid/distal calf region. Single-vessel runoff from posterior tibial artery. LEFT Lower Extremity Inflow: Native left common and external iliac arteries are occluded. Limited evaluation of the left internal iliac artery branches. Left femoral bypass graft is occluded. Outflow: Minimal reconstitution of the distal left common femoral artery. There is flow in the left deep femoral arteries. Left SFA is occluded. Reconstitution of the mid popliteal artery above the knee. Distal popliteal artery is heavily calcified and diseased. Runoff: Calcifications and disease in the proximal runoff vessels. A small amount of the flow in the proximal runoff vessels. No significant runoff flow in the mid or distal calf. Veins: No obvious venous abnormality within the limitations of this arterial phase study. Review of the MIP images confirms the above findings. NON-VASCULAR Lower chest: Emphysematous changes at the lung bases. No pleural effusions. Several small pulmonary nodules at the lung bases. Largest measures 5 mm in the left lower lobe on sequence 4, image 8. Most of nodules appear to be stable 05/17/2008. There is a questionable nodular density in the lingula on sequence 4, image 6 that was probably present on the study from 10/14/2017. Hepatobiliary: No acute abnormality to the liver or gallbladder. Pancreas: Unremarkable. No pancreatic ductal dilatation or surrounding inflammatory changes. Spleen: Normal in size without focal abnormality. Adrenals/Urinary Tract: Normal adrenal glands. Fluid in the urinary bladder. Normal appearance of both kidneys. No hydronephrosis. No suspicious renal lesions.  Stomach/Bowel: Stomach is within normal limits. Appendix appears normal. No evidence of bowel wall thickening, distention, or inflammatory changes. Lymphatic: No significant lymph node enlargement in the abdomen or pelvis. Reproductive: Prostate is unremarkable. Other: Negative for free fluid.  Negative for free air. Musculoskeletal: No acute bone abnormality. Subtle lucency along the left iliac wing is chronic. IMPRESSION: VASCULAR 1. 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 2. Reconstitution of the deep femoral arteries bilaterally. Flow in the distal right SFA and right popliteal artery. Reconstitution of the mid left popliteal artery. 3. Severe left runoff disease. No significant flow below the mid left calf. Right runoff disease with single-vessel runoff to the right ankle from the right posterior tibial artery. 4. Bilateral renal artery stenosis. NON-VASCULAR 1. Most of the small pulmonary nodules at the lung bases have not changed since 2010 but there is a questionable indeterminate small nodule in lingula. Non-contrast chest CT can be considered in 12 months based on underlying emphysema. This recommendation follows the consensus statement: Guidelines for Management of Incidental Pulmonary Nodules Detected on CT Images: From the Fleischner Society 2017; Radiology 2017; 284:228-243. These results were called by telephone at the time of interpretation on 03/18/2018 at 4:01 pm to Dr. Alphonzo Lemmings, who verbally acknowledged these results. Electronically Signed   By: Richarda Overlie M.D.   On: 03/18/2018 16:31       Today   Subjective:   Kyle Gordon patient denying any symptoms doing well o Objective:   Blood pressure 123/80, pulse 78, temperature 98.2 F (36.8 C), temperature source Oral, resp. rate 19, height  (1.702 m), weight 55.1 kg, SpO2 99 %.  .  Intake/Output Summary (Last 24 hours) at 03/22/2018 1345 Last data filed at 03/22/2018  1000 Gross per 24 hour  Intake 600 ml  Output 250 ml  Net 350 ml  Exam VITAL SIGNS: Blood pressure 123/80, pulse 78, temperature 98.2 F (36.8 C), temperature source Oral, resp. rate 19, height 5\' 7"  (1.702 m), weight 55.1 kg, SpO2 99 %.  GENERAL:  58 y.o.-year-old patient lying in the bed with no acute distress.  EYES: Pupils equal, round, reactive to light and accommodation. No scleral icterus. Extraocular muscles intact.  HEENT: Head atraumatic, normocephalic. Oropharynx and nasopharynx clear.  NECK:  Supple, no jugular venous distention. No thyroid enlargement, no tenderness.  LUNGS: Normal breath sounds bilaterally, no wheezing, rales,rhonchi or crepitation. No use of accessory muscles of respiration.  CARDIOVASCULAR: S1, S2 normal. No murmurs, rubs, or gallops.  ABDOMEN: Soft, nontender, nondistended. Bowel sounds present. No organomegaly or mass.  EXTREMITIES: No pedal edema, cyanosis, or clubbing.  NEUROLOGIC: Cranial nerves II through XII are intact. Muscle strength 5/5 in all extremities. Sensation intact. Gait not checked.  PSYCHIATRIC: The patient is alert and oriented x 3.  SKIN: No obvious rash, lesion, or ulcer.   Data Review     CBC w Diff:  Lab Results  Component Value Date   WBC 5.2 03/22/2018   HGB 8.9 (L) 03/22/2018   HCT 25.9 (L) 03/22/2018   PLT 125 (L) 03/22/2018   LYMPHOPCT 13 03/18/2018   MONOPCT 5 03/18/2018   EOSPCT 0 03/18/2018   BASOPCT 0 03/18/2018   CMP:  Lab Results  Component Value Date   NA 137 03/22/2018   K 3.2 (L) 03/22/2018   CL 106 03/22/2018   CO2 26 03/22/2018   BUN 14 03/22/2018   CREATININE 0.70 03/22/2018   PROT 8.4 (H) 03/18/2018   ALBUMIN 4.3 03/18/2018   BILITOT 1.1 03/18/2018   ALKPHOS 99 03/18/2018   AST 30 03/18/2018   ALT 16 03/18/2018  .  Micro Results Recent Results (from the past 240 hour(s))  MRSA PCR Screening     Status: None   Collection Time: 03/19/18  6:35 PM  Result Value Ref Range Status    MRSA by PCR NEGATIVE NEGATIVE Final    Comment:        The GeneXpert MRSA Assay (FDA approved for NASAL specimens only), is one component of a comprehensive MRSA colonization surveillance program. It is not intended to diagnose MRSA infection nor to guide or monitor treatment for MRSA infections. Performed at [facility], [address] [place], [place] [address]         Code Status Orders  (From admission, onward)         Start     Ordered   03/18/18 2139  Full code  Continuous     03/18/18 2138        Code Status History    Date Active Date Inactive Code Status Order ID Comments User Context   11/13/2017 1533 11/18/2017 1819 Full Code [id number]  Annice Needyew, Jason S, MD Inpatient   10/14/2017 0930 10/16/2017 0310 Full Code [id number]  Annice Needyew, Jason S, MD Inpatient          Follow-up Information    Annice Needyew, Jason S, MD Follow up in 2 week(s).   Specialties:  Vascular Surgery, Radiology, Interventional Cardiology Why:  hosp f/u Contact information: [address] [place] [address] [phone]           Discharge Medications   Allergies as of 03/22/2018   No Known Allergies     Medication List    STOP taking these medications   cephALEXin 500 MG capsule Commonly known as:  KEFLEX   clopidogrel 75  MG tablet Commonly known as:  PLAVIX   docusate sodium 100 MG capsule Commonly known as:  COLACE   oxyCODONE-acetaminophen 5-325 MG tablet Commonly known as:  PERCOCET/ROXICET   traMADol 50 MG tablet Commonly known as:  ULTRAM     TAKE these medications   amLODipine 5 MG tablet Commonly known as:  NORVASC Take 1 tablet (5 mg total) by mouth daily.   apixaban 5 MG Tabs tablet Commonly known as:  ELIQUIS Take 1 tablet (5 mg total) by mouth 2 (two) times daily.   aspirin 81 MG EC tablet Take 1 tablet (81 mg total) by mouth daily.   atorvastatin 10 MG tablet Commonly known as:  LIPITOR Take 1 tablet (10 mg total) by mouth daily.    labetalol 100 MG tablet Commonly known as:  NORMODYNE Take 1 tablet (100 mg total) by mouth 2 (two) times daily.   nicotine 14 mg/24hr patch Commonly known as:  NICODERM CQ - dosed in mg/24 hours Place 1 patch (14 mg total) onto the skin daily. Start taking on:  March 23, 2018   potassium chloride SA 20 MEQ tablet Commonly known as:  K-DUR,KLOR-CON Take 2 tablets (40 mEq total) by mouth daily for 2 days.          Total Time in preparing paper work, data evaluation and todays exam - 35 minutes  Auburn Bilberry M.D on 03/22/2018 at 1:45 PM [facility]   Office  [phone]

## 2018-03-22 NOTE — Progress Notes (Signed)
Subjective: Interval History: has no complaint of pain. He feels well and has been walking around yesterday.  Objective: Vital signs in last 24 hours: Temp:  [98.1 F (36.7 C)-98.2 F (36.8 C)] 98.2 F (36.8 C) (02/22 0429) Pulse Rate:  [70-89] 70 (02/22 0429) Resp:  [12-27] 19 (02/22 0429) BP: (100-127)/(58-72) 107/72 (02/22 0429) SpO2:  [97 %-100 %] 99 % (02/22 0429)  Intake/Output from previous day: 02/21 0701 - 02/22 0700 In: 792.6 [P.O.:720; I.V.:72.6] Out: -  Intake/Output this shift: No intake/output data recorded.  General appearance: alert, cooperative and appears stated age Extremities: extremities normal, atraumatic, no cyanosis or edema Skin: Skin color, texture, turgor normal. No rashes or lesions or with good perfusion  Lab Results: Recent Labs    03/21/18 0445 03/22/18 0644  WBC 6.0 5.2  HGB 9.7* 8.9*  HCT 28.4* 25.9*  PLT 121* 125*   BMET Recent Labs    03/21/18 0445 03/22/18 0644  NA 136 137  K 3.3* 3.2*  CL 106 106  CO2 25 26  GLUCOSE 98 96  BUN 10 14  CREATININE 0.71 0.70  CALCIUM 7.8* 8.0*    Studies/Results: Ct Angio Aortobifemoral W And/or Wo Contrast  Result Date: 03/18/2018 CLINICAL DATA:  58 year old with history of an aortobifemoral bypass on 11/13/2017. Patient presents with bilateral leg pain and numbness. EXAM: CT ANGIOGRAPHY OF ABDOMINAL AORTA WITH ILIOFEMORAL RUNOFF TECHNIQUE: Multidetector CT imaging of the abdomen, pelvis and lower extremities was performed using the standard protocol during bolus administration of intravenous contrast. Multiplanar CT image reconstructions and MIPs were obtained to evaluate the vascular anatomy. CONTRAST:  OMNIPAQUE IOHEXOL 350 MG/ML SOLN COMPARISON:  None. FINDINGS: VASCULAR Aorta: Occlusion of the infrarenal abdominal aorta. The aortobifemoral bypass is occluded bilaterally. Native distal abdominal aorta is occluded. Celiac: Mild atherosclerotic disease in the proximal celiac trunk without  significant stenosis. Left gastric artery, splenic artery and common hepatic artery are patent. SMA: Atherosclerotic disease in the SMA. SMA is patent. No significant stenosis at the origin. Renals: Moderate stenosis in the proximal right renal artery. Right renal artery is patent. At least mild stenosis in the proximal left renal artery. IMA: Reconstitution of the IMA branches.  Origin of IMA is occluded. RIGHT Lower Extremity Inflow: Native right external and common iliac arteries are patent. Right femoral bypass graft is occluded. Small amount of flow in internal iliac artery branches but difficult to evaluate due to calcifications. Outflow: Reconstitution of the right common femoral artery through collateral flow. Stenosis and possible thrombus in the right common femoral arter.y right profunda femoral arteries are patent. Proximal and mid right SFA is occluded. Reconstitution of the very distal SFA. Popliteal artery is patent. Runoff: Tibioperoneal trunk is heavily calcified. Disease and stenosis at the origin of the anterior tibial artery. Peroneal artery and anterior tibial artery occluded in the mid/distal calf region. Single-vessel runoff from posterior tibial artery. LEFT Lower Extremity Inflow: Native left common and external iliac arteries are occluded. Limited evaluation of the left internal iliac artery branches. Left femoral bypass graft is occluded. Outflow: Minimal reconstitution of the distal left common femoral artery. There is flow in the left deep femoral arteries. Left SFA is occluded. Reconstitution of the mid popliteal artery above the knee. Distal popliteal artery is heavily calcified and diseased. Runoff: Calcifications and disease in the proximal runoff vessels. A small amount of the flow in the proximal runoff vessels. No significant runoff flow in the mid or distal calf. Veins: No obvious venous abnormality within the  limitations of this arterial phase study. Review of the MIP images  confirms the above findings. NON-VASCULAR Lower chest: Emphysematous changes at the lung bases. No pleural effusions. Several small pulmonary nodules at the lung bases. Largest measures 5 mm in the left lower lobe on sequence 4, image 8. Most of nodules appear to be stable 05/17/2008. There is a questionable nodular density in the lingula on sequence 4, image 6 that was probably present on the study from 10/14/2017. Hepatobiliary: No acute abnormality to the liver or gallbladder. Pancreas: Unremarkable. No pancreatic ductal dilatation or surrounding inflammatory changes. Spleen: Normal in size without focal abnormality. Adrenals/Urinary Tract: Normal adrenal glands. Fluid in the urinary bladder. Normal appearance of both kidneys. No hydronephrosis. No suspicious renal lesions. Stomach/Bowel: Stomach is within normal limits. Appendix appears normal. No evidence of bowel wall thickening, distention, or inflammatory changes. Lymphatic: No significant lymph node enlargement in the abdomen or pelvis. Reproductive: Prostate is unremarkable. Other: Negative for free fluid.  Negative for free air. Musculoskeletal: No acute bone abnormality. Subtle lucency along the left iliac wing is chronic. IMPRESSION: VASCULAR 1. 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 2. Reconstitution of the deep femoral arteries bilaterally. Flow in the distal right SFA and right popliteal artery. Reconstitution of the mid left popliteal artery. 3. Severe left runoff disease. No significant flow below the mid left calf. Right runoff disease with single-vessel runoff to the right ankle from the right posterior tibial artery. 4. Bilateral renal artery stenosis. NON-VASCULAR 1. Most of the small pulmonary nodules at the lung bases have not changed since 2010 but there is a questionable indeterminate small nodule in lingula. Non-contrast chest CT can be considered in 12 months based on underlying  emphysema. This recommendation follows the consensus statement: Guidelines for Management of Incidental Pulmonary Nodules Detected on CT Images: From the Fleischner Society 2017; Radiology 2017; 284:228-243. These results were called by telephone at the time of interpretation on 03/18/2018 at 4:01 pm to Dr. Alphonzo Lemmings, who verbally acknowledged these results. Electronically Signed   By: Richarda Overlie M.D.   On: 03/18/2018 16:31   Anti-infectives: Anti-infectives (From admission, onward)   Start     Dose/Rate Route Frequency Ordered Stop   03/20/18 1127  ceFAZolin (ANCEF) 2-4 GM/100ML-% IVPB    Note to Pharmacy:  Kelton Pillar   : cabinet override      03/20/18 1127 03/20/18 1210   03/19/18 1036  ceFAZolin (ANCEF) 2-4 GM/100ML-% IVPB    Note to Pharmacy:  Littie Deeds  : cabinet override      03/19/18 1036 03/19/18 1826   03/19/18 0700  ceFAZolin (ANCEF) IVPB 2g/100 mL premix    Note to Pharmacy:  Please send with patient to specials   2 g 200 mL/hr over 30 Minutes Intravenous On call 03/18/18 1715 03/20/18 0700      Assessment/Plan: s/p Procedure(s): ABDOMINAL AORTOGRAM (N/A) Ambulate today and discharge if feels well. Follow up in office with Vascular service.   LOS: 4 days   Leonides Sake 03/22/2018, 9:23 AM

## 2018-03-22 NOTE — Care Management Note (Signed)
Case Management Note  Patient Details  Name: Kyle Gordon MRN: [id number] Date of Birth: 08/25/1960  Subjective/Objective:    Eliquis voucher given. Medication management and [facility] application provided.                 Action/Plan:   Expected Discharge Date:  03/22/18               Expected Discharge Plan:  Home/Self Care  In-House Referral:     Discharge planning Services  CM Consult, Medication Assistance, Indigent Health Clinic  Post Acute Care Choice:    Choice offered to:     DME Arranged:    DME Agency:     HH Arranged:    HH Agency:     Status of Service:  Completed, signed off  If discussed at [employer], dates discussed:    Additional Comments:  [name] A Niki Cosman, RN 03/22/2018, 1:21 PM

## 2018-04-11 ENCOUNTER — Ambulatory Visit (INDEPENDENT_AMBULATORY_CARE_PROVIDER_SITE_OTHER): Payer: MEDICAID | Admitting: Vascular Surgery

## 2018-04-15 ENCOUNTER — Ambulatory Visit (INDEPENDENT_AMBULATORY_CARE_PROVIDER_SITE_OTHER): Payer: MEDICAID | Admitting: Vascular Surgery

## 2018-04-15 ENCOUNTER — Other Ambulatory Visit: Payer: Self-pay

## 2018-04-15 ENCOUNTER — Encounter (INDEPENDENT_AMBULATORY_CARE_PROVIDER_SITE_OTHER): Payer: Self-pay | Admitting: Vascular Surgery

## 2018-04-15 VITALS — BP 116/74 | HR 75 | Resp 10 | Ht 67.0 in | Wt 117.0 lb

## 2018-04-15 DIAGNOSIS — I700 Atherosclerosis of aorta: Secondary | ICD-10-CM

## 2018-04-15 DIAGNOSIS — I739 Peripheral vascular disease, unspecified: Secondary | ICD-10-CM

## 2018-04-15 DIAGNOSIS — I7410 Embolism and thrombosis of unspecified parts of aorta: Secondary | ICD-10-CM

## 2018-04-15 DIAGNOSIS — Z7901 Long term (current) use of anticoagulants: Secondary | ICD-10-CM

## 2018-04-15 DIAGNOSIS — F17200 Nicotine dependence, unspecified, uncomplicated: Secondary | ICD-10-CM

## 2018-04-15 DIAGNOSIS — R030 Elevated blood-pressure reading, without diagnosis of hypertension: Secondary | ICD-10-CM

## 2018-04-15 NOTE — Progress Notes (Signed)
MRN : [id number]  Kyle Gordon is a 58 y.o. (04/06/1960) male who presents with chief complaint of  Chief Complaint  Patient presents with   Follow-up  .  History of Present Illness: Patient returns today in follow up of his PAD.  About a month ago, he underwent endovascular revascularization for an occluded aortobifemoral bypass.  This involved overnight thrombolytic therapy and subsequent stent placement to the proximal portion of the graft up into the aorta.  He is doing well.  His feet and legs are feeling much better.  He still has occasional pain particularly his left heel and foot but overall he is walking well and feeling well.  He is still smoking although he has cut back.  No periprocedural complications.  He remains on Eliquis.  Current Outpatient Medications  Medication Sig Dispense Refill   amLODipine (NORVASC) 5 MG tablet Take 1 tablet (5 mg total) by mouth daily. 30 tablet 3   apixaban (ELIQUIS) 5 MG TABS tablet Take 1 tablet (5 mg total) by mouth 2 (two) times daily. 60 tablet 2   aspirin 81 MG EC tablet Take 1 tablet (81 mg total) by mouth daily. 90 tablet 3   atorvastatin (LIPITOR) 10 MG tablet Take 1 tablet (10 mg total) by mouth daily. 30 tablet 11   labetalol (NORMODYNE) 100 MG tablet Take 1 tablet (100 mg total) by mouth 2 (two) times daily. 60 tablet 3   nicotine (NICODERM CQ - DOSED IN MG/24 HOURS) 14 mg/24hr patch Place 1 patch (14 mg total) onto the skin daily. 28 patch 0   potassium chloride SA (K-DUR,KLOR-CON) 20 MEQ tablet Take 2 tablets (40 mEq total) by mouth daily for 2 days. 4 tablet 0   No current facility-administered medications for this visit.     Past Medical History:  Diagnosis Date   Arthritis    Hypertension    no meds   Peripheral vascular disease (HCC)    pad    Past Surgical History:  Procedure Laterality Date   ABDOMINAL AORTOGRAM N/A 03/20/2018   Procedure: ABDOMINAL AORTOGRAM;  Surgeon: Annice Needy, MD;  Location:  [facility];  Service: Cardiovascular;  Laterality: N/A;   AORTA - BILATERAL FEMORAL ARTERY BYPASS GRAFT Bilateral 11/13/2017   Procedure: AORTA BIFEMORAL BYPASS GRAFT;  Surgeon: Annice Needy, MD;  Location: [facility];  Service: Vascular;  Laterality: Bilateral;   INSERTION OF ILIAC STENT Right 11/13/2017   Procedure: INSERTION OF ILIAC STENT (SFA STENT PLACEMENT );  Surgeon: Annice Needy, MD;  Location: [facility];  Service: Vascular;  Laterality: Right;   LOWER EXTREMITY ANGIOGRAPHY Right 10/14/2017   Procedure: LOWER EXTREMITY ANGIOGRAPHY;  Surgeon: Annice Needy, MD;  Location: [facility];  Service: Cardiovascular;  Laterality: Right;   LOWER EXTREMITY ANGIOGRAPHY Bilateral 03/19/2018   Procedure: LOWER EXTREMITY ANGIOGRAPHY;  Surgeon: Annice Needy, MD;  Location: [facility];  Service: Cardiovascular;  Laterality: Bilateral;   MOUTH SURGERY     Social History        Tobacco Use   Smoking status: Current Every Day Smoker   Smokeless tobacco: Never Used  Substance Use Topics   Alcohol use: Yes   Drug use: Yes    Types: Marijuana    Family History      Family History  Problem Relation Age of Onset   Heart attack Father   No bleeding disorders, clotting disorders, or aneurysms  No Known Allergies   REVIEW  OF SYSTEMS (Negative unless checked)  Constitutional: [] ?Weight loss  [] ?Fever  [] ?Chills Cardiac: [] ?Chest pain   [] ?Chest pressure   [] ?Palpitations   [] ?Shortness of breath when laying flat   [] ?Shortness of breath at rest   [] ?Shortness of breath with exertion. Vascular:  [x] ?Pain in legs with walking   [] ?Pain in legs at rest   [] ?Pain in legs when laying flat   [] ?Claudication   [] ?Pain in feet when walking  [x] ?Pain in feet at rest  [] ?Pain in feet when laying flat   [] ?History of DVT   [] ?Phlebitis   [] ?Swelling in legs   [] ?Varicose veins   [] ?Non-healing ulcers Pulmonary:   [] ?Uses home oxygen   [] ?Productive cough    [] ?Hemoptysis   [] ?Wheeze  [] ?COPD   [] ?Asthma Neurologic:  [] ?Dizziness  [] ?Blackouts   [] ?Seizures   [] ?History of stroke   [] ?History of TIA  [] ?Aphasia   [] ?Temporary blindness   [] ?Dysphagia   [] ?Weakness or numbness in arms   [] ?Weakness or numbness in legs Musculoskeletal:  [] ?Arthritis   [] ?Joint swelling   [] ?Joint pain   [] ?Low back pain Hematologic:  [] ?Easy bruising  [] ?Easy bleeding   [] ?Hypercoagulable state   [] ?Anemic   Gastrointestinal:  [] ?Blood in stool   [] ?Vomiting blood  [] ?Gastroesophageal reflux/heartburn   [] ?Abdominal pain Genitourinary:  [] ?Chronic kidney disease   [] ?Difficult urination  [] ?Frequent urination  [] ?Burning with urination   [] ?Hematuria Skin:  [] ?Rashes   [x] ?Ulcers   [x] ?Wounds Psychological:  [] ?History of anxiety   [] ? History of major depression.    Physical Examination  BP 116/74 (BP Location: Left Arm, Patient Position: Sitting, Cuff Size: Small)    Pulse 75    Resp 10    Ht 5\' 7"  (1.702 m)    Wt 117 lb (53.1 kg)    BMI 18.32 kg/m  Gen:  WD/WN, NAD Head: [place]/AT, No temporalis wasting. Ear/Nose/Throat: Hearing grossly intact, nares w/o erythema or drainage Eyes: Conjunctiva clear. Sclera non-icteric Neck: Supple.  Trachea midline Pulmonary:  Good air movement, no use of accessory muscles.  Cardiac: RRR, no JVD Vascular:  Vessel Right Left  Radial Palpable Palpable                          PT 1+ Palpable 1+ Palpable  DP Trace Palpable Trace Palpable    Musculoskeletal: M/S 5/5 throughout.  No deformity or atrophy. No edema. Neurologic: Sensation grossly intact in extremities.  Symmetrical.  Speech is fluent.  Psychiatric: Judgment intact, Mood & affect appropriate for pt's clinical situation. Dermatologic: No rashes or ulcers noted.  No cellulitis or open wounds. Access sites and previous surgical scars well healed       Labs Recent Results (from the past 2160 hour(s))  Comprehensive metabolic panel     Status: Abnormal    Collection Time: 03/18/18  2:21 PM  Result Value Ref Range   Sodium 137 135 - 145 mmol/L   Potassium 3.6 3.5 - 5.1 mmol/L    Comment: HEMOLYSIS AT THIS LEVEL MAY AFFECT RESULT   Chloride 104 98 - 111 mmol/L   CO2 22 22 - 32 mmol/L   Glucose, Bld 198 (H) 70 - 99 mg/dL   BUN 7 6 - 20 mg/dL   Creatinine, Ser 1.610.72 0.61 - 1.24 mg/dL   Calcium 9.6 8.9 - 09.610.3 mg/dL   Total Protein 8.4 (H) 6.5 - 8.1 g/dL   Albumin 4.3 3.5 - 5.0 g/dL   AST 30 15 - 41  U/L   ALT 16 0 - 44 U/L   Alkaline Phosphatase 99 38 - 126 U/L   Total Bilirubin 1.1 0.3 - 1.2 mg/dL   GFR calc non Af Amer >60 >60 mL/min   GFR calc Af Amer >60 >60 mL/min   Anion gap 11 5 - 15    Comment: Performed at [facility], [address] [place], [place] [address]  CBC with Differential     Status: Abnormal   Collection Time: 03/18/18  2:21 PM  Result Value Ref Range   WBC 13.4 (H) 4.0 - 10.5 K/uL   RBC 4.60 4.22 - 5.81 MIL/uL   Hemoglobin 15.9 13.0 - 17.0 g/dL   HCT 60.4 54.0 - 98.1 %   MCV 100.7 (H) 80.0 - 100.0 fL   MCH 34.6 (H) 26.0 - 34.0 pg   MCHC 34.3 30.0 - 36.0 g/dL   RDW 19.1 47.8 - 29.5 %   Platelets 215 150 - 400 K/uL   nRBC 0.0 0.0 - 0.2 %   Neutrophils Relative % 82 %   Neutro Abs 11.0 (H) 1.7 - 7.7 K/uL   Lymphocytes Relative 13 %   Lymphs Abs 1.7 0.7 - 4.0 K/uL   Monocytes Relative 5 %   Monocytes Absolute 0.7 0.1 - 1.0 K/uL   Eosinophils Relative 0 %   Eosinophils Absolute 0.0 0.0 - 0.5 K/uL   Basophils Relative 0 %   Basophils Absolute 0.0 0.0 - 0.1 K/uL   Immature Granulocytes 0 %   Abs Immature Granulocytes 0.06 0.00 - 0.07 K/uL    Comment: Performed at [facility], [address] [place], [place] [address]  Protime-INR     Status: None   Collection Time: 03/18/18  2:21 PM  Result Value Ref Range   Prothrombin Time 12.7 11.4 - 15.2 seconds   INR 0.96     Comment: Performed at [facility], [address] [place], [place] [address]  APTT     Status: Abnormal    Collection Time: 03/18/18  4:35 PM  Result Value Ref Range   aPTT 126 (H) 24 - 36 seconds    Comment:        IF BASELINE aPTT IS ELEVATED, SUGGEST PATIENT RISK ASSESSMENT BE USED TO DETERMINE APPROPRIATE ANTICOAGULANT THERAPY. Performed at [facility], [address] [place], [place] [address]   Heparin level (unfractionated)     Status: None   Collection Time: 03/18/18 10:00 PM  Result Value Ref Range   Heparin Unfractionated 0.66 0.30 - 0.70 IU/mL    Comment: (NOTE) If heparin results are below expected values, and patient dosage has  been confirmed, suggest follow up testing of antithrombin III levels. Performed at [facility], [address] [place], [place] [address]   HIV antibody (Routine Testing)     Status: None   Collection Time: 03/18/18 10:00 PM  Result Value Ref Range   HIV Screen 4th Generation wRfx Non Reactive Non Reactive    Comment: (NOTE) Performed At: [facility] [address] [place], [place] [id number] Jolene Schimke MD VO:[id number]   TSH     Status: None   Collection Time: 03/18/18 10:00 PM  Result Value Ref Range   TSH 1.546 0.350 - 4.500 uIU/mL    Comment: Performed by a 3rd Generation assay with a functional sensitivity of <=0.01 uIU/mL. Performed at [facility], [address], [place], [place] [address]   CBC     Status: Abnormal  Collection Time: 03/19/18  5:20 AM  Result Value Ref Range   WBC 9.1 4.0 - 10.5 K/uL   RBC 4.39 4.22 - 5.81 MIL/uL   Hemoglobin 15.3 13.0 - 17.0 g/dL   HCT 13.0 86.5 - 78.4 %   MCV 101.1 (H) 80.0 - 100.0 fL   MCH 34.9 (H) 26.0 - 34.0 pg   MCHC 34.5 30.0 - 36.0 g/dL   RDW 69.6 29.5 - 28.4 %   Platelets 192 150 - 400 K/uL   nRBC 0.0 0.0 - 0.2 %    Comment: Performed at [facility], [address] [place], [place] [address]  Basic metabolic panel     Status: Abnormal   Collection Time: 03/19/18  5:20 AM  Result Value Ref Range   Sodium 135 135 - 145  mmol/L   Potassium 3.7 3.5 - 5.1 mmol/L   Chloride 103 98 - 111 mmol/L   CO2 25 22 - 32 mmol/L   Glucose, Bld 132 (H) 70 - 99 mg/dL   BUN 5 (L) 6 - 20 mg/dL   Creatinine, Ser 0.10 (L) 0.61 - 1.24 mg/dL   Calcium 9.0 8.9 - 27.2 mg/dL   GFR calc non Af Amer >60 >60 mL/min   GFR calc Af Amer >60 >60 mL/min   Anion gap 7 5 - 15    Comment: Performed at [facility], [address] [place], [place] [address]  Magnesium     Status: None   Collection Time: 03/19/18  5:20 AM  Result Value Ref Range   Magnesium 1.9 1.7 - 2.4 mg/dL    Comment: Performed at [facility], [address] [place], [place] [address]  Protime-INR     Status: None   Collection Time: 03/19/18  5:20 AM  Result Value Ref Range   Prothrombin Time 12.2 11.4 - 15.2 seconds   INR 0.91     Comment: Performed at [facility], [address] [place], [place] [address]  Heparin level (unfractionated)     Status: Abnormal   Collection Time: 03/19/18  5:20 AM  Result Value Ref Range   Heparin Unfractionated 1.23 (H) 0.30 - 0.70 IU/mL    Comment: (NOTE) If heparin results are below expected values, and patient dosage has  been confirmed, suggest follow up testing of antithrombin III levels. Performed at [facility], [address] [place], [place] [address]   Type and screen Ordered by PROVIDER DEFAULT     Status: None   Collection Time: 03/19/18  7:25 AM  Result Value Ref Range   ABO/RH(D) A POS    Antibody Screen NEG    Sample Expiration      03/22/2018 Performed at [facility], [address] [place], [place] [address]   Heparin level (unfractionated)     Status: None   Collection Time: 03/19/18  9:04 AM  Result Value Ref Range   Heparin Unfractionated 0.53 0.30 - 0.70 IU/mL    Comment: (NOTE) If heparin results are below expected values, and patient dosage has  been confirmed, suggest follow up testing of antithrombin III levels. Performed at [facility], [address] [place], [place] [address]   Glucose, capillary     Status: Abnormal   Collection Time: 03/19/18  5:45 PM  Result Value Ref Range   Glucose-Capillary 103 (H) 70 - 99 mg/dL  Heparin level (unfractionated) every 6 hours x 4 post-procedure     Status: None   Collection Time:  03/19/18  6:35 PM  Result Value Ref Range   Heparin Unfractionated 0.33 0.30 - 0.70 IU/mL    Comment: (NOTE) If heparin results are below expected values, and patient dosage has  been confirmed, suggest follow up testing of antithrombin III levels. Performed at [facility], [address] [place], [place] [address]   CBC every 6 hours x 4 post-procedure     Status: Abnormal   Collection Time: 03/19/18  6:35 PM  Result Value Ref Range   WBC 10.8 (H) 4.0 - 10.5 K/uL   RBC 4.22 4.22 - 5.81 MIL/uL   Hemoglobin 14.6 13.0 - 17.0 g/dL   HCT 41.3 24.4 - 01.0 %   MCV 100.0 80.0 - 100.0 fL   MCH 34.6 (H) 26.0 - 34.0 pg   MCHC 34.6 30.0 - 36.0 g/dL   RDW 27.2 53.6 - 64.4 %   Platelets 157 150 - 400 K/uL   nRBC 0.0 0.0 - 0.2 %    Comment: Performed at [facility], [address] [place], [place] [address]  Fibrinogen every 6 hours x 4 post-procedure     Status: Abnormal   Collection Time: 03/19/18  6:35 PM  Result Value Ref Range   Fibrinogen 178 (L) 210 - 475 mg/dL    Comment: Performed at [facility], [address] [place], [place] [address]  MRSA PCR Screening     Status: None   Collection Time: 03/19/18  6:35 PM  Result Value Ref Range   MRSA by PCR NEGATIVE NEGATIVE    Comment:        The GeneXpert MRSA Assay (FDA approved for NASAL specimens only), is one component of a comprehensive MRSA colonization surveillance program. It is not intended to diagnose MRSA infection nor to guide or monitor treatment for MRSA infections. Performed at [facility], [address] [place], [place] [address]   Heparin level (unfractionated)  every 6 hours x 4 post-procedure     Status: None   Collection Time: 03/20/18 12:47 AM  Result Value Ref Range   Heparin Unfractionated 0.42 0.30 - 0.70 IU/mL    Comment: (NOTE) If heparin results are below expected values, and patient dosage has  been confirmed, suggest follow up testing of antithrombin III levels. Performed at [facility], [address] [place], [place] [address]   CBC every 6 hours x 4 post-procedure     Status: Abnormal   Collection Time: 03/20/18 12:47 AM  Result Value Ref Range   WBC 11.5 (H) 4.0 - 10.5 K/uL   RBC 3.88 (L) 4.22 - 5.81 MIL/uL   Hemoglobin 13.5 13.0 - 17.0 g/dL   HCT 51.8 (L) 84.1 - 66.0 %   MCV 100.3 (H) 80.0 - 100.0 fL   MCH 34.8 (H) 26.0 - 34.0 pg   MCHC 34.7 30.0 - 36.0 g/dL   RDW 63.0 16.0 - 10.9 %   Platelets 133 (L) 150 - 400 K/uL   nRBC 0.0 0.0 - 0.2 %    Comment: Performed at [facility], [address] [place], [place] [address]  Fibrinogen every 6 hours x 4 post-procedure     Status: Abnormal   Collection Time: 03/20/18 12:47 AM  Result Value Ref Range   Fibrinogen 206 (L) 210 - 475 mg/dL    Comment: Performed at [facility], [address] [place], [place] [address]  Heparin level (unfractionated) every 6 hours x 4 post-procedure     Status: None  Collection Time: 03/20/18  4:49 AM  Result Value Ref Range   Heparin Unfractionated 0.43 0.30 - 0.70 IU/mL    Comment: (NOTE) If heparin results are below expected values, and patient dosage has  been confirmed, suggest follow up testing of antithrombin III levels. Performed at [facility], [address] [place], [place] [address]   CBC every 6 hours x 4 post-procedure     Status: Abnormal   Collection Time: 03/20/18  4:49 AM  Result Value Ref Range   WBC 11.7 (H) 4.0 - 10.5 K/uL   RBC 3.64 (L) 4.22 - 5.81 MIL/uL   Hemoglobin 12.6 (L) 13.0 - 17.0 g/dL   HCT 60.4 (L) 54.0 - 98.1 %   MCV 100.5 (H) 80.0 - 100.0 fL   MCH 34.6 (H)  26.0 - 34.0 pg   MCHC 34.4 30.0 - 36.0 g/dL   RDW 19.1 47.8 - 29.5 %   Platelets 152 150 - 400 K/uL   nRBC 0.0 0.0 - 0.2 %    Comment: Performed at [facility], [address] [place], [place] [address]  Fibrinogen every 6 hours x 4 post-procedure     Status: None   Collection Time: 03/20/18  4:49 AM  Result Value Ref Range   Fibrinogen 239 210 - 475 mg/dL    Comment: Performed at [facility], [address] [place], [place] [address]  Basic metabolic panel     Status: Abnormal   Collection Time: 03/20/18  4:49 AM  Result Value Ref Range   Sodium 134 (L) 135 - 145 mmol/L   Potassium 3.5 3.5 - 5.1 mmol/L   Chloride 103 98 - 111 mmol/L   CO2 24 22 - 32 mmol/L   Glucose, Bld 121 (H) 70 - 99 mg/dL   BUN 10 6 - 20 mg/dL   Creatinine, Ser 4.69 0.61 - 1.24 mg/dL   Calcium 8.2 (L) 8.9 - 10.3 mg/dL   GFR calc non Af Amer >60 >60 mL/min   GFR calc Af Amer >60 >60 mL/min   Anion gap 7 5 - 15    Comment: Performed at [facility], [address] [place], [place] [address]  Heparin level (unfractionated)     Status: None   Collection Time: 03/20/18  8:57 PM  Result Value Ref Range   Heparin Unfractionated 0.35 0.30 - 0.70 IU/mL    Comment: (NOTE) If heparin results are below expected values, and patient dosage has  been confirmed, suggest follow up testing of antithrombin III levels. Performed at [facility], [address] [place], [place] [address]   Heparin level (unfractionated)     Status: None   Collection Time: 03/21/18  3:15 AM  Result Value Ref Range   Heparin Unfractionated 0.44 0.30 - 0.70 IU/mL    Comment: (NOTE) If heparin results are below expected values, and patient dosage has  been confirmed, suggest follow up testing of antithrombin III levels. Performed at [facility], [address] [place], [place] [address]   Basic metabolic panel     Status: Abnormal   Collection Time: 03/21/18  4:45 AM  Result Value  Ref Range   Sodium 136 135 - 145 mmol/L   Potassium 3.3 (L) 3.5 - 5.1 mmol/L   Chloride 106 98 - 111 mmol/L   CO2 25 22 - 32 mmol/L   Glucose, Bld 98 70 - 99 mg/dL   BUN 10 6 - 20 mg/dL   Creatinine, Ser 2.72 0.61 -  1.24 mg/dL   Calcium 7.8 (L) 8.9 - 10.3 mg/dL   GFR calc non Af Amer >60 >60 mL/min   GFR calc Af Amer >60 >60 mL/min   Anion gap 5 5 - 15    Comment: Performed at [facility], [address] [place], [place] [address]  CBC     Status: Abnormal   Collection Time: 03/21/18  4:45 AM  Result Value Ref Range   WBC 6.0 4.0 - 10.5 K/uL   RBC 2.74 (L) 4.22 - 5.81 MIL/uL   Hemoglobin 9.7 (L) 13.0 - 17.0 g/dL   HCT 60.4 (L) 54.0 - 98.1 %   MCV 103.6 (H) 80.0 - 100.0 fL   MCH 35.4 (H) 26.0 - 34.0 pg   MCHC 34.2 30.0 - 36.0 g/dL   RDW 19.1 47.8 - 29.5 %   Platelets 121 (L) 150 - 400 K/uL    Comment: Immature Platelet Fraction may be clinically indicated, consider ordering this additional test [id number]    nRBC 0.0 0.0 - 0.2 %    Comment: Performed at [facility], [address] [place], [place] [address]  CBC     Status: Abnormal   Collection Time: 03/22/18  6:44 AM  Result Value Ref Range   WBC 5.2 4.0 - 10.5 K/uL   RBC 2.55 (L) 4.22 - 5.81 MIL/uL   Hemoglobin 8.9 (L) 13.0 - 17.0 g/dL   HCT 62.9 (L) 52.8 - 41.3 %   MCV 101.6 (H) 80.0 - 100.0 fL   MCH 34.9 (H) 26.0 - 34.0 pg   MCHC 34.4 30.0 - 36.0 g/dL   RDW 24.4 01.0 - 27.2 %   Platelets 125 (L) 150 - 400 K/uL    Comment: Immature Platelet Fraction may be clinically indicated, consider ordering this additional test [id number]    nRBC 0.0 0.0 - 0.2 %    Comment: Performed at [facility], [address] [place], [place] [address]  Basic metabolic panel     Status: Abnormal   Collection Time: 03/22/18  6:44 AM  Result Value Ref Range   Sodium 137 135 - 145 mmol/L   Potassium 3.2 (L) 3.5 - 5.1 mmol/L   Chloride 106 98 - 111 mmol/L   CO2 26 22 - 32 mmol/L   Glucose, Bld 96 70 -  99 mg/dL   BUN 14 6 - 20 mg/dL   Creatinine, Ser 9.56 0.61 - 1.24 mg/dL   Calcium 8.0 (L) 8.9 - 10.3 mg/dL   GFR calc non Af Amer >60 >60 mL/min   GFR calc Af Amer >60 >60 mL/min   Anion gap 5 5 - 15    Comment: Performed at [facility], [address] [place], [place] [address]  Magnesium     Status: None   Collection Time: 03/22/18  6:44 AM  Result Value Ref Range   Magnesium 2.0 1.7 - 2.4 mg/dL    Comment: Performed at [facility], [address] [place], [place] [address]    Radiology Ct Angio Aortobifemoral W And/or Wo Contrast  Result Date: 03/18/2018 CLINICAL DATA:  59 year old with history of an aortobifemoral bypass on 11/13/2017. Patient presents with bilateral leg pain and numbness. EXAM: CT ANGIOGRAPHY OF ABDOMINAL AORTA WITH ILIOFEMORAL RUNOFF TECHNIQUE: Multidetector CT imaging of the abdomen, pelvis and lower extremities was performed using the standard protocol during bolus administration of intravenous contrast. Multiplanar CT image reconstructions and MIPs were obtained to evaluate the vascular anatomy. CONTRAST:  OMNIPAQUE  IOHEXOL 350 MG/ML SOLN COMPARISON:  None. FINDINGS: VASCULAR Aorta: Occlusion of the infrarenal abdominal aorta. The aortobifemoral bypass is occluded bilaterally. Native distal abdominal aorta is occluded. Celiac: Mild atherosclerotic disease in the proximal celiac trunk without significant stenosis. Left gastric artery, splenic artery and common hepatic artery are patent. SMA: Atherosclerotic disease in the SMA. SMA is patent. No significant stenosis at the origin. Renals: Moderate stenosis in the proximal right renal artery. Right renal artery is patent. At least mild stenosis in the proximal left renal artery. IMA: Reconstitution of the IMA branches.  Origin of IMA is occluded. RIGHT Lower Extremity Inflow: Native right external and common iliac arteries are patent. Right femoral bypass graft is occluded. Small amount of flow  in internal iliac artery branches but difficult to evaluate due to calcifications. Outflow: Reconstitution of the right common femoral artery through collateral flow. Stenosis and possible thrombus in the right common femoral arter.y right profunda femoral arteries are patent. Proximal and mid right SFA is occluded. Reconstitution of the very distal SFA. Popliteal artery is patent. Runoff: Tibioperoneal trunk is heavily calcified. Disease and stenosis at the origin of the anterior tibial artery. Peroneal artery and anterior tibial artery occluded in the mid/distal calf region. Single-vessel runoff from posterior tibial artery. LEFT Lower Extremity Inflow: Native left common and external iliac arteries are occluded. Limited evaluation of the left internal iliac artery branches. Left femoral bypass graft is occluded. Outflow: Minimal reconstitution of the distal left common femoral artery. There is flow in the left deep femoral arteries. Left SFA is occluded. Reconstitution of the mid popliteal artery above the knee. Distal popliteal artery is heavily calcified and diseased. Runoff: Calcifications and disease in the proximal runoff vessels. A small amount of the flow in the proximal runoff vessels. No significant runoff flow in the mid or distal calf. Veins: No obvious venous abnormality within the limitations of this arterial phase study. Review of the MIP images confirms the above findings. NON-VASCULAR Lower chest: Emphysematous changes at the lung bases. No pleural effusions. Several small pulmonary nodules at the lung bases. Largest measures 5 mm in the left lower lobe on sequence 4, image 8. Most of nodules appear to be stable 05/17/2008. There is a questionable nodular density in the lingula on sequence 4, image 6 that was probably present on the study from 10/14/2017. Hepatobiliary: No acute abnormality to the liver or gallbladder. Pancreas: Unremarkable. No pancreatic ductal dilatation or surrounding  inflammatory changes. Spleen: Normal in size without focal abnormality. Adrenals/Urinary Tract: Normal adrenal glands. Fluid in the urinary bladder. Normal appearance of both kidneys. No hydronephrosis. No suspicious renal lesions. Stomach/Bowel: Stomach is within normal limits. Appendix appears normal. No evidence of bowel wall thickening, distention, or inflammatory changes. Lymphatic: No significant lymph node enlargement in the abdomen or pelvis. Reproductive: Prostate is unremarkable. Other: Negative for free fluid.  Negative for free air. Musculoskeletal: No acute bone abnormality. Subtle lucency along the left iliac wing is chronic. IMPRESSION: VASCULAR 1. 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 2. Reconstitution of the deep femoral arteries bilaterally. Flow in the distal right SFA and right popliteal artery. Reconstitution of the mid left popliteal artery. 3. Severe left runoff disease. No significant flow below the mid left calf. Right runoff disease with single-vessel runoff to the right ankle from the right posterior tibial artery. 4. Bilateral renal artery stenosis. NON-VASCULAR 1. Most of the small pulmonary nodules at the lung bases have not changed since 2010 but  there is a questionable indeterminate small nodule in lingula. Non-contrast chest CT can be considered in 12 months based on underlying emphysema. This recommendation follows the consensus statement: Guidelines for Management of Incidental Pulmonary Nodules Detected on CT Images: From the Fleischner Society 2017; Radiology 2017; 284:228-243. These results were called by telephone at the time of interpretation on 03/18/2018 at 4:01 pm to Dr. Alphonzo Lemmings, who verbally acknowledged these results. Electronically Signed   By: Richarda Overlie M.D.   On: 03/18/2018 16:31    Assessment/Plan Tobacco use disorder This is clearly an underlying cause of his vascular disease.  Smoking cessation would be  of benefit to prevent further deterioration of his vascular disease as well as reduce his perioperative complication risk.  We spent 3 to 4 minutes discussing this today including options including replacement therapy or medications like Chantix to cut cravings.  He has cut way back and says he will try to stop before his next visit.  Elevated BP without diagnosis of hypertension His blood pressure is quite high today.  I suspect he has undiagnosed hypertension.  We discussed that this increases his chance of progression of vascular disease and this may need to be brought under better control.  Aortic occlusion [facility]) S/p revascularization  Claudication of both lower extremities (HCC) Symptoms markedly improved after revascularization.  Ulcer is healed.  Smoking cessation.  Continue current medical regimen.  Recheck with noninvasive studies in 2 to 3 months    Festus Barren, MD  04/15/2018 2:10 PM    This note was created with Dragon medical transcription system.  Any errors from dictation are purely unintentional

## 2018-04-15 NOTE — Assessment & Plan Note (Signed)
S/p revascularization

## 2018-04-15 NOTE — Assessment & Plan Note (Signed)
Symptoms markedly improved after revascularization.  Ulcer is healed.  Smoking cessation.  Continue current medical regimen.  Recheck with noninvasive studies in 2 to 3 months

## 2018-04-22 ENCOUNTER — Telehealth (INDEPENDENT_AMBULATORY_CARE_PROVIDER_SITE_OTHER): Payer: Self-pay | Admitting: Vascular Surgery

## 2018-04-22 NOTE — Telephone Encounter (Signed)
We would prefer eliquis, but we understand it can be expensive without insurance.  We can do plavix and aspirin, with are both generic and should be more affordable.  However, because this is a different type of blood thinner he is at greater risk for re-thrombosis of his stent, since he has done it once before.  He should contact us sooner than his scheduled follow up if he begins to have leg pain or ulcerations.    Please verify his pharmacy and I will have it sent in tomorrow morning

## 2018-04-22 NOTE — Telephone Encounter (Signed)
Patient prescribed Eliquis but has no insurance and is unable to afford RX. Costing patient over $500. Requesting rx blood thinner he can afford to pay cash for. Please advise. AS, CMA

## 2018-04-23 ENCOUNTER — Other Ambulatory Visit (INDEPENDENT_AMBULATORY_CARE_PROVIDER_SITE_OTHER): Payer: Self-pay | Admitting: Nurse Practitioner

## 2018-04-23 MED ORDER — CLOPIDOGREL BISULFATE 75 MG PO TABS: 75.0000 mg | ORAL_TABLET | Freq: Every day | ORAL | 11 refills | Status: DC

## 2018-04-23 NOTE — Telephone Encounter (Signed)
Patient is aware of the below. Verbalized understanding.  Uses [facility] on [address] In [place].

## 2018-04-23 NOTE — Telephone Encounter (Signed)
Order Sent

## 2018-04-28 ENCOUNTER — Encounter (INDEPENDENT_AMBULATORY_CARE_PROVIDER_SITE_OTHER): Payer: Self-pay | Admitting: Vascular Surgery

## 2018-07-17 ENCOUNTER — Encounter (INDEPENDENT_AMBULATORY_CARE_PROVIDER_SITE_OTHER): Payer: Self-pay

## 2018-07-17 ENCOUNTER — Ambulatory Visit (INDEPENDENT_AMBULATORY_CARE_PROVIDER_SITE_OTHER): Payer: Self-pay | Admitting: Nurse Practitioner

## 2018-08-06 ENCOUNTER — Other Ambulatory Visit: Payer: Self-pay

## 2018-08-06 ENCOUNTER — Ambulatory Visit (INDEPENDENT_AMBULATORY_CARE_PROVIDER_SITE_OTHER): Payer: Medicaid Other | Admitting: Nurse Practitioner

## 2018-08-06 ENCOUNTER — Ambulatory Visit (INDEPENDENT_AMBULATORY_CARE_PROVIDER_SITE_OTHER): Payer: Medicaid Other

## 2018-08-06 ENCOUNTER — Encounter (INDEPENDENT_AMBULATORY_CARE_PROVIDER_SITE_OTHER): Payer: Self-pay | Admitting: Nurse Practitioner

## 2018-08-06 VITALS — BP 172/92 | HR 78 | Resp 10 | Ht 66.0 in | Wt 123.0 lb

## 2018-08-06 DIAGNOSIS — I739 Peripheral vascular disease, unspecified: Secondary | ICD-10-CM | POA: Diagnosis not present

## 2018-08-06 DIAGNOSIS — Z95828 Presence of other vascular implants and grafts: Secondary | ICD-10-CM | POA: Diagnosis not present

## 2018-08-06 DIAGNOSIS — I7410 Embolism and thrombosis of unspecified parts of aorta: Secondary | ICD-10-CM

## 2018-08-06 DIAGNOSIS — I70299 Other atherosclerosis of native arteries of extremities, unspecified extremity: Secondary | ICD-10-CM

## 2018-08-06 DIAGNOSIS — I700 Atherosclerosis of aorta: Secondary | ICD-10-CM

## 2018-08-06 NOTE — Progress Notes (Signed)
Patient not seen by provider, will return at later date. No charge.

## 2018-08-10 ENCOUNTER — Encounter (INDEPENDENT_AMBULATORY_CARE_PROVIDER_SITE_OTHER): Payer: Self-pay | Admitting: Nurse Practitioner

## 2018-08-12 ENCOUNTER — Ambulatory Visit (INDEPENDENT_AMBULATORY_CARE_PROVIDER_SITE_OTHER): Payer: Medicaid Other | Admitting: Nurse Practitioner

## 2018-09-17 ENCOUNTER — Telehealth (INDEPENDENT_AMBULATORY_CARE_PROVIDER_SITE_OTHER): Payer: Self-pay | Admitting: Vascular Surgery

## 2018-09-17 NOTE — Telephone Encounter (Signed)
Do they have some sort of clearance form that they can fax [place] so that we can return? thanks

## 2018-09-17 NOTE — Telephone Encounter (Signed)
Dr. Larwance Rote office is closed for the day. I will try to contact tomorrow. AS, CMA

## 2018-09-17 NOTE — Telephone Encounter (Signed)
Tiffany with Dr. Christell Constant office calling requesting written documentation of how to d/c anticoagulant for patient up coming tooth extraction. Please advise. AS, CMA

## 2018-09-18 NOTE — Telephone Encounter (Signed)
Spoke with front desk rep at [place] office and they are going to send [place] over a procedure clearance. AS, CMA

## 2018-09-22 ENCOUNTER — Other Ambulatory Visit (INDEPENDENT_AMBULATORY_CARE_PROVIDER_SITE_OTHER): Payer: Self-pay | Admitting: Nurse Practitioner

## 2019-09-14 ENCOUNTER — Ambulatory Visit: Payer: Medicaid Other | Attending: Internal Medicine

## 2019-09-14 DIAGNOSIS — Z23 Encounter for immunization: Secondary | ICD-10-CM

## 2019-09-14 NOTE — Progress Notes (Signed)
   Covid-19 Vaccination Clinic  Name:  Kyle Gordon    MRN: [id number] DOB: 1960/04/13  09/14/2019  Mr. Kyle Gordon was observed post Covid-19 immunization for 15 minutes without incident. He was provided with Vaccine Information Sheet and instruction to access the V-Safe system.   Mr. Kyle Gordon was instructed to call 911 with any severe reactions post vaccine: Marland Kitchen Difficulty breathing  . Swelling of face and throat  . A fast heartbeat  . A bad rash all over body  . Dizziness and weakness   Immunizations Administered    Name Date Dose VIS Date Route   Pfizer COVID-19 Vaccine 09/14/2019 10:23 AM 0.3 mL 03/25/2018 Intramuscular   Manufacturer: [employer], [employer]   Lot: [id number]   NDC: [address]-4

## 2019-09-24 IMAGING — CT CT ANGIO AOBIFEM WO/W CM
2 of 10 series · 11 of 46 positions shown, 14 images · IV contrast (APPLIED)
Comparison: None.

CLINICAL DATA: 57-year-old with history of an aortobifemoral bypass
on 11/13/2017. Patient presents with bilateral leg pain and
numbness.

EXAM:
CT ANGIOGRAPHY OF ABDOMINAL AORTA WITH ILIOFEMORAL RUNOFF
TECHNIQUE: Multidetector CT imaging of the abdomen, pelvis and lower
extremities was performed using the standard protocol during bolus
administration of intravenous contrast. Multiplanar CT image
reconstructions and MIPs were obtained to evaluate the vascular
anatomy.
CONTRAST:  100mL OMNIPAQUE IOHEXOL 350 MG/ML SOLN

[Series 10: axial arterial lower · axial · arterial · 0.75mm/px · z∈[-462,+240]mm · 10 of 279 slices shown, 13 images]
[im 30/279  soft-tissue]
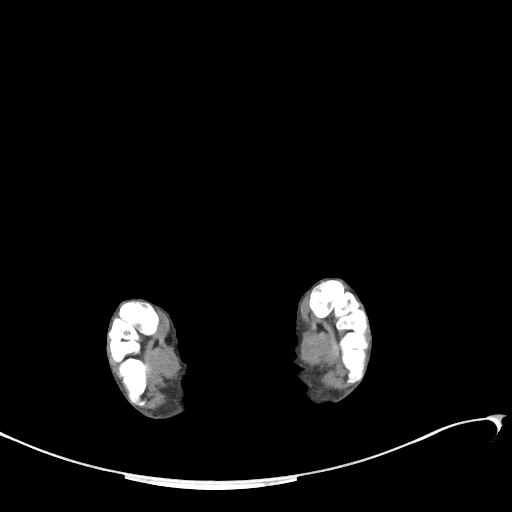
[im 30/279  bone]
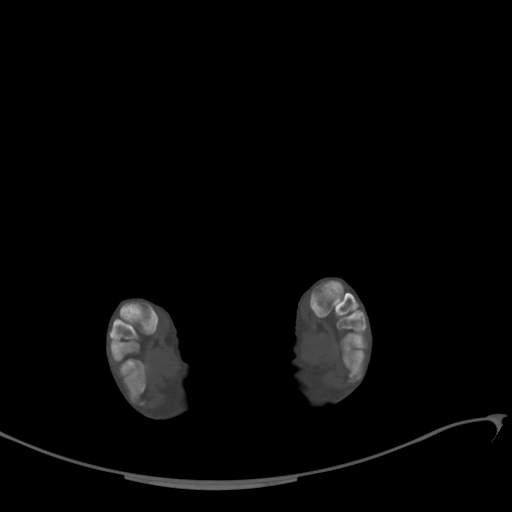
[im 59/279  soft-tissue]
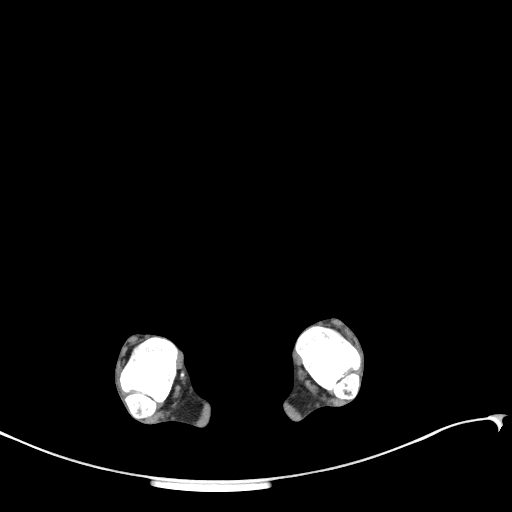
[im 88/279  soft-tissue]
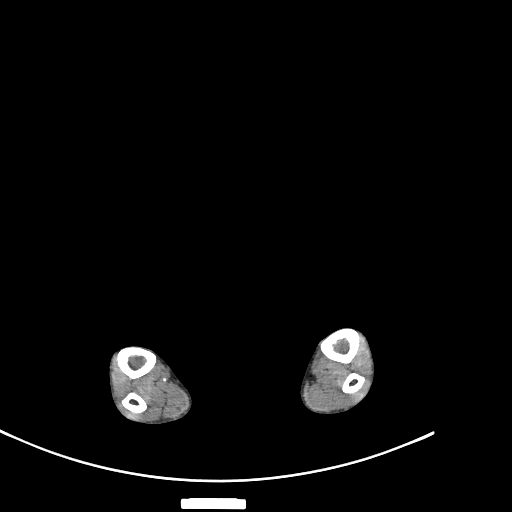
[im 118/279  soft-tissue]
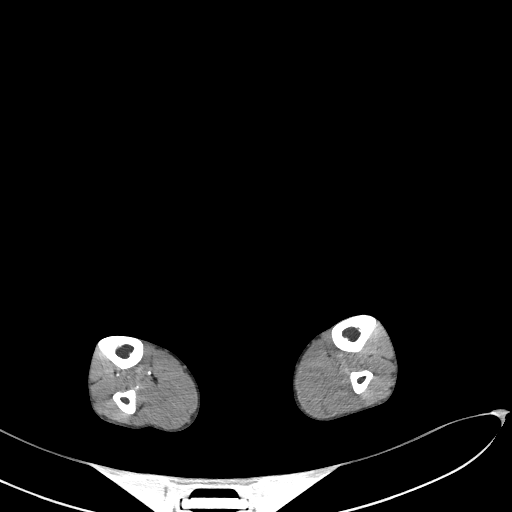
[im 161/279  soft-tissue]
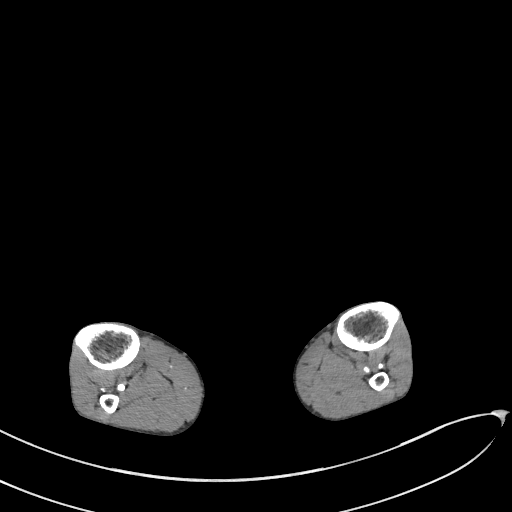
[im 191/279  soft-tissue]
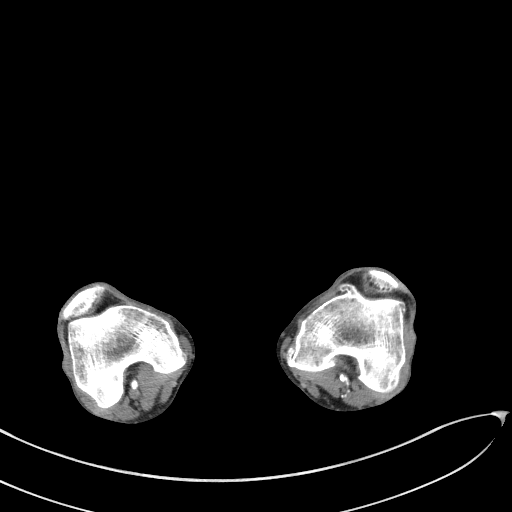
[im 220/279  soft-tissue]
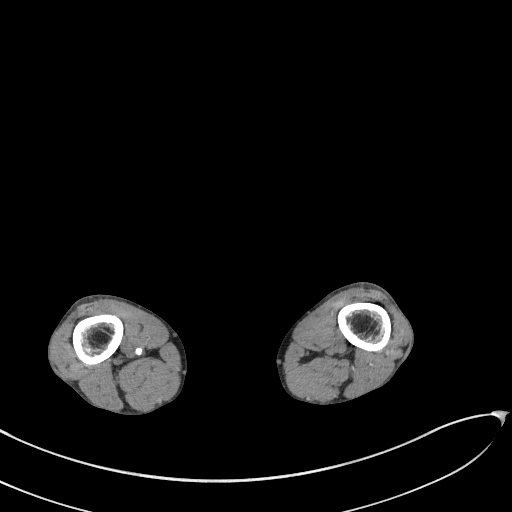
[im 220/279  lung]
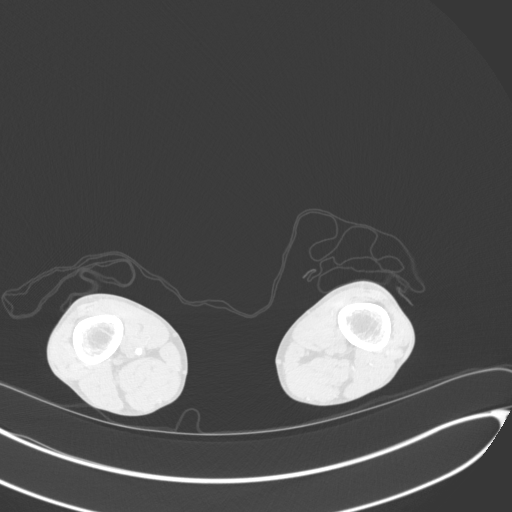
[im 235/279  lung]
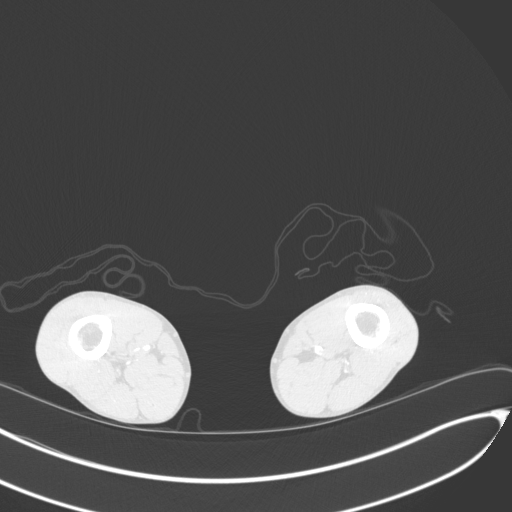
[im 249/279  soft-tissue]
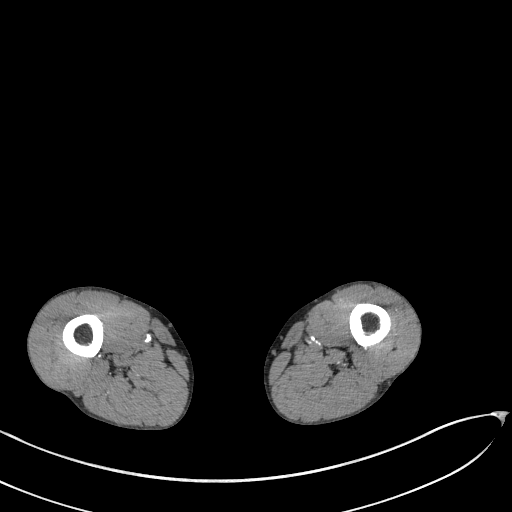
[im 249/279  lung]
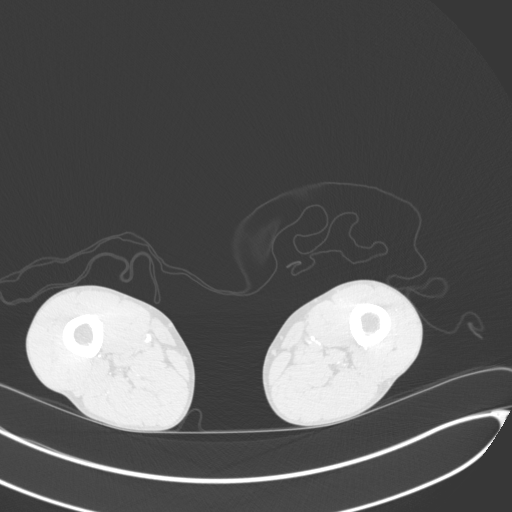
[im 264/279  lung]
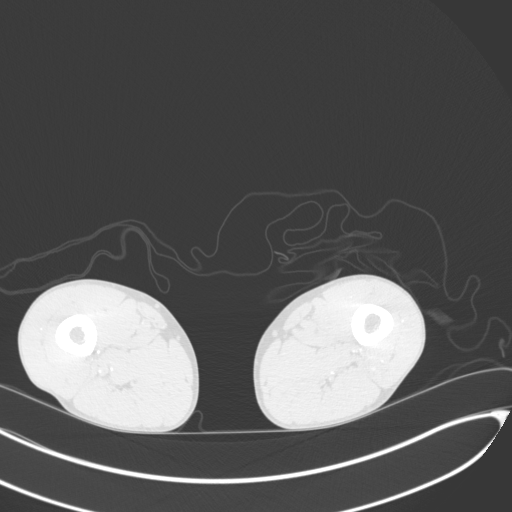

[Series 11: coronal lower · coronal · 0.69mm/px · 1 of 150 slices shown]
[im 75/150  soft-tissue]
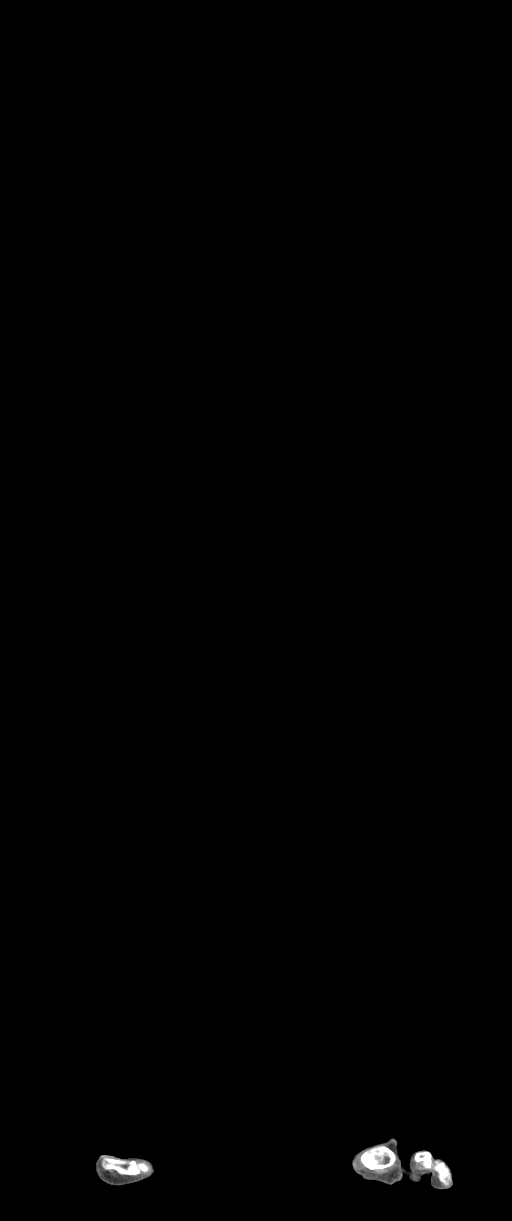

[11 of 46 positions shown; findings below may reference images not displayed]

FINDINGS: VASCULAR

Aorta: Occlusion of the infrarenal abdominal aorta. The
aortobifemoral bypass is occluded bilaterally. Native distal
abdominal aorta is occluded.

Celiac: Mild atherosclerotic disease in the proximal celiac trunk
without significant stenosis. Left gastric artery, splenic artery
and common hepatic artery are patent.

SMA: Atherosclerotic disease in the SMA. SMA is patent. No
significant stenosis at the origin.

Renals: Moderate stenosis in the proximal right renal artery. Right
renal artery is patent. At least mild stenosis in the proximal left
renal artery.

IMA: Reconstitution of the IMA branches.  Origin of IMA is occluded.

RIGHT Lower Extremity

Inflow: Native right external and common iliac arteries are patent.
Right femoral bypass graft is occluded. Small amount of flow in
internal iliac artery branches but difficult to evaluate due to
calcifications.

Outflow: Reconstitution of the right common femoral artery through
collateral flow. Stenosis and possible thrombus in the right common
femoral arter.y right profunda femoral arteries are patent. Proximal
and mid right SFA is occluded. Reconstitution of the very distal
SFA. Popliteal artery is patent.

Runoff: Tibioperoneal trunk is heavily calcified. Disease and
stenosis at the origin of the anterior tibial artery. Peroneal
artery and anterior tibial artery occluded in the mid/distal calf
region. Single-vessel runoff from posterior tibial artery.

LEFT Lower Extremity

Inflow: Native left common and external iliac arteries are occluded.
Limited evaluation of the left internal iliac artery branches. Left
femoral bypass graft is occluded.

Outflow: Minimal reconstitution of the distal left common femoral
artery. There is flow in the left deep femoral arteries. Left SFA is
occluded. Reconstitution of the mid popliteal artery above the knee.
Distal popliteal artery is heavily calcified and diseased.

Runoff: Calcifications and disease in the proximal runoff vessels. A
small amount of the flow in the proximal runoff vessels. No
significant runoff flow in the mid or distal calf.

Veins: No obvious venous abnormality within the limitations of this
arterial phase study.

Review of the MIP images confirms the above findings.

NON-VASCULAR

Lower chest: Emphysematous changes at the lung bases. No pleural
effusions. Several small pulmonary nodules at the lung bases.
Largest measures 5 mm in the left lower lobe on sequence 4, image 8.
Most of nodules appear to be stable 05/17/2008. There is a
questionable nodular density in the lingula on sequence 4, image 6
that was probably present on the study from 10/14/2017.

Hepatobiliary: No acute abnormality to the liver or gallbladder.

Pancreas: Unremarkable. No pancreatic ductal dilatation or
surrounding inflammatory changes.

Spleen: Normal in size without focal abnormality.

Adrenals/Urinary Tract: Normal adrenal glands. Fluid in the urinary
bladder. Normal appearance of both kidneys. No hydronephrosis. No
suspicious renal lesions.

Stomach/Bowel: Stomach is within normal limits. Appendix appears
normal. No evidence of bowel wall thickening, distention, or
inflammatory changes.

Lymphatic: No significant lymph node enlargement in the abdomen or
pelvis.

Reproductive: Prostate is unremarkable.

Other: Negative for free fluid.  Negative for free air.

Musculoskeletal: No acute bone abnormality. Subtle lucency along the
left iliac wing is chronic.
IMPRESSION: VASCULAR

1. Complete occlusion of the infrarenal abdominal aorta including
the aortobifemoral bypass graft.
2. Reconstitution of the deep femoral arteries bilaterally. Flow in
the distal right SFA and right popliteal artery. Reconstitution of
the mid left popliteal artery.
3. Severe left runoff disease. No significant flow below the mid
left calf. Right runoff disease with single-vessel runoff to the
right ankle from the right posterior tibial artery.
4. Bilateral renal artery stenosis.

NON-VASCULAR

1. Most of the small pulmonary nodules at the lung bases have not
changed since 9585 but there is a questionable indeterminate small
nodule in lingula. Non-contrast chest CT can be considered in 12
months based on underlying emphysema.. This recommendation follows
the consensus statement: Guidelines for Management of Incidental
Pulmonary Nodules Detected on CT Images: From the [HOSPITAL]

These results were called by telephone at the time of interpretation
on 03/18/2018 at [DATE] to Dr. Hoffer, who verbally acknowledged
these results.

## 2019-10-12 ENCOUNTER — Ambulatory Visit: Payer: Medicaid Other | Attending: Internal Medicine

## 2019-10-12 ENCOUNTER — Ambulatory Visit: Payer: Self-pay

## 2019-10-12 DIAGNOSIS — Z23 Encounter for immunization: Secondary | ICD-10-CM

## 2019-10-12 NOTE — Progress Notes (Signed)
   Covid-19 Vaccination Clinic  Name:  Kyle Gordon    MRN: [id number] DOB: [birth year]  10/12/2019  [name] was observed post Covid-19 immunization for 15 minutes without incident. He was provided with Vaccine Information Sheet and instruction to access the V-Safe system.   Mr. More was instructed to call 911 with any severe reactions post vaccine: Marland Kitchen Difficulty breathing  . Swelling of face and throat  . A fast heartbeat  . A bad rash all over body  . Dizziness and weakness   Immunizations Administered    Name Date Dose VIS Date Route   Pfizer COVID-19 Vaccine 10/12/2019 10:09 AM 0.3 mL 03/25/2018 Intramuscular   Manufacturer: [employer], [employer]   Lot: [id number]   NDC: [address]-9

## 2019-12-14 ENCOUNTER — Other Ambulatory Visit (INDEPENDENT_AMBULATORY_CARE_PROVIDER_SITE_OTHER): Payer: Self-pay | Admitting: Nurse Practitioner

## 2020-03-08 ENCOUNTER — Other Ambulatory Visit (INDEPENDENT_AMBULATORY_CARE_PROVIDER_SITE_OTHER): Payer: Self-pay | Admitting: Nurse Practitioner

## 2020-03-09 NOTE — Telephone Encounter (Signed)
Is this ok to refill pt has not been seen here in a year  was being seen for atherosclerosis

## 2020-03-09 NOTE — Telephone Encounter (Signed)
Patient will need follow up with [facility] to receive refills after this

## 2020-04-15 ENCOUNTER — Other Ambulatory Visit (INDEPENDENT_AMBULATORY_CARE_PROVIDER_SITE_OTHER): Payer: Self-pay | Admitting: Nurse Practitioner

## 2020-05-25 ENCOUNTER — Other Ambulatory Visit (INDEPENDENT_AMBULATORY_CARE_PROVIDER_SITE_OTHER): Payer: Self-pay | Admitting: Nurse Practitioner

## 2020-05-27 ENCOUNTER — Other Ambulatory Visit (INDEPENDENT_AMBULATORY_CARE_PROVIDER_SITE_OTHER): Payer: Self-pay | Admitting: Nurse Practitioner

## 2020-05-27 NOTE — Telephone Encounter (Signed)
Pt has not been seen in a year is this ok to fill?

## 2020-05-27 NOTE — Telephone Encounter (Signed)
This pt needs a ABI and visit with the NP

## 2020-06-07 ENCOUNTER — Other Ambulatory Visit (INDEPENDENT_AMBULATORY_CARE_PROVIDER_SITE_OTHER): Payer: Self-pay | Admitting: Nurse Practitioner

## 2020-06-07 DIAGNOSIS — Z95828 Presence of other vascular implants and grafts: Secondary | ICD-10-CM

## 2020-06-07 DIAGNOSIS — I739 Peripheral vascular disease, unspecified: Secondary | ICD-10-CM

## 2020-06-08 ENCOUNTER — Other Ambulatory Visit: Payer: Self-pay

## 2020-06-08 ENCOUNTER — Ambulatory Visit (INDEPENDENT_AMBULATORY_CARE_PROVIDER_SITE_OTHER): Payer: Medicaid Other

## 2020-06-08 ENCOUNTER — Ambulatory Visit (INDEPENDENT_AMBULATORY_CARE_PROVIDER_SITE_OTHER): Payer: Medicaid Other | Admitting: Nurse Practitioner

## 2020-06-08 ENCOUNTER — Encounter (INDEPENDENT_AMBULATORY_CARE_PROVIDER_SITE_OTHER): Payer: Self-pay | Admitting: Nurse Practitioner

## 2020-06-08 VITALS — BP 190/90 | HR 70 | Resp 17 | Ht 66.0 in | Wt 125.0 lb

## 2020-06-08 DIAGNOSIS — R030 Elevated blood-pressure reading, without diagnosis of hypertension: Secondary | ICD-10-CM | POA: Diagnosis not present

## 2020-06-08 DIAGNOSIS — I739 Peripheral vascular disease, unspecified: Secondary | ICD-10-CM | POA: Diagnosis not present

## 2020-06-08 DIAGNOSIS — Z95828 Presence of other vascular implants and grafts: Secondary | ICD-10-CM

## 2020-06-08 DIAGNOSIS — F17200 Nicotine dependence, unspecified, uncomplicated: Secondary | ICD-10-CM

## 2020-06-08 MED ORDER — CLOPIDOGREL BISULFATE 75 MG PO TABS: 1.0000 | ORAL_TABLET | Freq: Every day | ORAL | 11 refills | Status: AC

## 2020-06-08 MED ORDER — AMLODIPINE BESYLATE 5 MG PO TABS: 5.0000 mg | ORAL_TABLET | Freq: Every day | ORAL | 1 refills | Status: AC

## 2020-06-08 MED ORDER — ATORVASTATIN CALCIUM 10 MG PO TABS: 10.0000 mg | ORAL_TABLET | Freq: Every day | ORAL | 11 refills | Status: DC

## 2020-06-08 MED ORDER — LABETALOL HCL 100 MG PO TABS: 100.0000 mg | ORAL_TABLET | Freq: Two times a day (BID) | ORAL | 1 refills | Status: AC

## 2020-06-19 ENCOUNTER — Encounter (INDEPENDENT_AMBULATORY_CARE_PROVIDER_SITE_OTHER): Payer: Self-pay | Admitting: Nurse Practitioner

## 2020-06-19 NOTE — Progress Notes (Signed)
Subjective:    Patient ID: Kyle Gordon, male    DOB: 1960/05/22, 60 y.o.   MRN: [id number] Chief Complaint  Patient presents with  . Follow-up    ultrasound    Kyle Gordon a 60 year old male returns to the office for followup and review of the noninvasive studies. There have been no interval changes in lower extremity symptoms.  The patient has a prior history of aorta bifemoral in 2019 with a bilateral common femoral artery and SFA endarterectomy.  He also underwent thrombectomy of that same bypass in 2020.  No interval shortening of the patient's claudication distance or development of rest pain symptoms. No new ulcers or wounds have occurred since the last visit.  There have been no significant changes to the patient's overall health care.  The patient denies amaurosis fugax or recent TIA symptoms. There are no recent neurological changes noted. The patient denies history of DVT, PE or superficial thrombophlebitis. The patient denies recent episodes of angina or shortness of breath.   ABI Rt=0.83 and Lt=0.69  (previous ABI's Rt=0.66 and Lt=0.60) Duplex ultrasound of the right tibial arteries reveals biphasic waveforms with monophasic waveforms of the left tibial arteries.  The patient has dampened toe waveforms bilaterally.  The patient has toe pressures of 102 on the right and 103 on the left   Review of Systems  Skin: Negative for wound.  All other systems reviewed and are negative.      Objective:   Physical Exam Vitals reviewed.  HENT:     Head: Normocephalic.  Cardiovascular:     Rate and Rhythm: Normal rate.     Pulses: Decreased pulses.  Pulmonary:     Effort: Pulmonary effort is normal.  Skin:    General: Skin is warm and dry.  Neurological:     Mental Status: He is alert and oriented to person, place, and time.  Psychiatric:        Mood and Affect: Mood normal.        Behavior: Behavior normal.        Thought Content: Thought content normal.         Judgment: Judgment normal.     BP (!) 190/90 (BP Location: Right Arm)   Pulse 70   Resp 17   Ht 5\' 6"  (1.676 m)   Wt 125 lb (56.7 kg)   BMI 20.18 kg/m   Past Medical History:  Diagnosis Date  . Arthritis   . Hypertension    no meds  . Peripheral vascular disease (HCC)    pad    Social History   Socioeconomic History  . Marital status: Married    Spouse name: Not on file  . Number of children: Not on file  . Years of education: Not on file  . Highest education level: Not on file  Occupational History  . Not on file  Tobacco Use  . Smoking status: Current Every Day Smoker  . Smokeless tobacco: Never Used  Vaping Use  . Vaping Use: Never used  Substance and Sexual Activity  . Alcohol use: Yes    Comment: 3/4 BEERS PER DAY  . Drug use: Yes    Types: Marijuana  . Sexual activity: Not on file  Other Topics Concern  . Not on file  Social History Narrative  . Not on file   Social Determinants of Health   Financial Resource Strain: Not on file  Food Insecurity: Not on file  Transportation Needs: Not on file  Physical  Activity: Not on file  Stress: Not on file  Social Connections: Not on file  Intimate Partner Violence: Not on file    Past Surgical History:  Procedure Laterality Date  . ABDOMINAL AORTOGRAM N/A 03/20/2018   Procedure: ABDOMINAL AORTOGRAM;  Surgeon: Annice Needy, MD;  Location: [facility];  Service: Cardiovascular;  Laterality: N/A;  . AORTA - BILATERAL FEMORAL ARTERY BYPASS GRAFT Bilateral 11/13/2017   Procedure: AORTA BIFEMORAL BYPASS GRAFT;  Surgeon: Annice Needy, MD;  Location: [facility];  Service: Vascular;  Laterality: Bilateral;  . INSERTION OF ILIAC STENT Right 11/13/2017   Procedure: INSERTION OF ILIAC STENT (SFA STENT PLACEMENT );  Surgeon: Annice Needy, MD;  Location: [facility];  Service: Vascular;  Laterality: Right;  . LOWER EXTREMITY ANGIOGRAPHY Right 10/14/2017   Procedure: LOWER EXTREMITY ANGIOGRAPHY;  Surgeon: Annice Needy, MD;  Location: [facility];  Service: Cardiovascular;  Laterality: Right;  . LOWER EXTREMITY ANGIOGRAPHY Bilateral 03/19/2018   Procedure: LOWER EXTREMITY ANGIOGRAPHY;  Surgeon: Annice Needy, MD;  Location: [facility];  Service: Cardiovascular;  Laterality: Bilateral;  . MOUTH SURGERY      Family History  Problem Relation Age of Onset  . Heart attack Father     No Known Allergies  CBC Latest Ref Rng & Units 03/22/2018 03/21/2018 03/20/2018  WBC 4.0 - 10.5 K/uL 5.2 6.0 11.7(H)  Hemoglobin 13.0 - 17.0 g/dL 6.6(Z) 9.9(J) 12.6(L)  Hematocrit 39.0 - 52.0 % 25.9(L) 28.4(L) 36.6(L)  Platelets 150 - 400 K/uL 125(L) 121(L) 152      CMP     Component Value Date/Time   NA 137 03/22/2018 0644   K 3.2 (L) 03/22/2018 0644   CL 106 03/22/2018 0644   CO2 26 03/22/2018 0644   GLUCOSE 96 03/22/2018 0644   BUN 14 03/22/2018 0644   CREATININE 0.70 03/22/2018 0644   CALCIUM 8.0 (L) 03/22/2018 0644   PROT 8.4 (H) 03/18/2018 1421   ALBUMIN 4.3 03/18/2018 1421   AST 30 03/18/2018 1421   ALT 16 03/18/2018 1421   ALKPHOS 99 03/18/2018 1421   BILITOT 1.1 03/18/2018 1421   GFRNONAA >60 03/22/2018 0644   GFRAA >60 03/22/2018 0644     VAS [place] ABI WITH/WO TBI  Result Date: 06/10/2020  LOWER EXTREMITY DOPPLER STUDY Patient Name:  Kyle Gordon  Date of Exam:   06/08/2020 Medical Rec #: [id number]           Accession #:    [id number] Date of Birth: Dec 26, 1960           Patient Gender: M Patient Age:   84Y Exam Location:  [facility] Procedure:      VAS [place] ABI WITH/WO TBI Referring Phys: [id number] Georgiana Spinner --------------------------------------------------------------------------------  Indications: Peripheral artery disease, and First ABI postop              Oct 2019; aorta enadart with aorto bifem BPG and bilat CFA and SFA              endarts.  Vascular Interventions: 03/20/2018: Aortogram and Bilateral Iliofemoral                         Angiograms. Mechanical  Thrombectomy, Stents placed in                         proximal portions of both limbs of the Aortobifemoral  bypass. Comparison Study: 08/06/2018 Performing Technologist: Reece Agar RT (R)(VS)  Examination Guidelines: A complete evaluation includes at minimum, Doppler waveform signals and systolic blood pressure reading at the level of bilateral brachial, anterior tibial, and posterior tibial arteries, when vessel segments are accessible. Bilateral testing is considered an integral part of a complete examination. Photoelectric Plethysmograph (PPG) waveforms and toe systolic pressure readings are included as required and additional duplex testing as needed. Limited examinations for reoccurring indications may be performed as noted.  ABI Findings: +---------+------------------+-----+--------+--------+ Right    Rt Pressure (mmHg)IndexWaveformComment  +---------+------------------+-----+--------+--------+ Brachial 174                                     +---------+------------------+-----+--------+--------+ ATA      117               0.65 biphasic         +---------+------------------+-----+--------+--------+ PTA      148               0.83 biphasic         +---------+------------------+-----+--------+--------+ Great Toe102               0.57 Abnormal         +---------+------------------+-----+--------+--------+ +---------+------------------+-----+----------+-------+ Left     Lt Pressure (mmHg)IndexWaveform  Comment +---------+------------------+-----+----------+-------+ Brachial 179                                      +---------+------------------+-----+----------+-------+ ATA      124               0.69 monophasic        +---------+------------------+-----+----------+-------+ PTA      104               0.58 monophasic        +---------+------------------+-----+----------+-------+ Great Toe103               0.58 Abnormal           +---------+------------------+-----+----------+-------+ +-------+-----------+-----------+------------+------------+ ABI/TBIToday's ABIToday's TBIPrevious ABIPrevious TBI +-------+-----------+-----------+------------+------------+ Right  .83        .57        .66         .70          +-------+-----------+-----------+------------+------------+ Left   .69        .58        .60         .53          +-------+-----------+-----------+------------+------------+ Bilateral ABIs appear essentially unchanged compared to prior study on 08/06/2018.  Summary: Right: Resting right ankle-brachial index indicates mild right lower extremity arterial disease. The right toe-brachial index is abnormal. Right TBI appears decreased as compared to the previous exam on 08/06/2018. Left: Resting left ankle-brachial index indicates moderate left lower extremity arterial disease. The left toe-brachial index is abnormal. Left TBI appears essentially unchanged as compared to the previous exam on 08/06/2018.  *See table(s) above for measurements and observations.  Electronically signed by Festus Barren MD on 06/10/2020 at 12:27:31 PM.    Final        Assessment & Plan:   1. Claudication of both lower extremities (HCC)  Recommend:  The patient has evidence of atherosclerosis of the lower extremities with claudication.  The patient does not voice lifestyle limiting changes at this point in time.  Noninvasive studies do not suggest clinically significant change.  No invasive studies, angiography or surgery at this time The patient should continue walking and begin a more formal exercise program.  The patient should continue antiplatelet therapy and aggressive treatment of the lipid abnormalities  No changes in the patient's medications at this time  The patient should continue wearing graduated compression socks 10-15 mmHg strength to control the mild edema.   Patient is advised for critical signs symptoms of PAD such as  cold foot, new wounds and nonhealing ulcerations.  If these are to occur the patient should seek emergency attention or call our office immediately. - clopidogrel (PLAVIX) 75 MG tablet; Take 1 tablet (75 mg total) by mouth daily.  Dispense: 30 tablet; Refill: 11 - atorvastatin (LIPITOR) 10 MG tablet; Take 1 tablet (10 mg total) by mouth daily.  Dispense: 30 tablet; Refill: 11 - VAS [place] ABI WITH/WO TBI; Future  2. Tobacco use disorder Smoking cessation was discussed, 3-10 minutes spent on this topic specifically   3. Elevated BP without diagnosis of hypertension The patient notes that he has an upcoming appointment with a new primary care provider.  Prior to this the patient has not had a primary care provider and these are medications that were filled during his most recent hospitalization.  We will offer her a one-time refill of these medications so that he can begin taking them prior to his follow-up with his primary care physician due to his extremely elevated blood pressures today.  The patient is advised that if he begins to have headaches or experience strokelike symptoms he should seek emergency attention. - amLODipine (NORVASC) 5 MG tablet; Take 1 tablet (5 mg total) by mouth daily.  Dispense: 30 tablet; Refill: 1 - labetalol (NORMODYNE) 100 MG tablet; Take 1 tablet (100 mg total) by mouth 2 (two) times daily.  Dispense: 60 tablet; Refill: 1   Current Outpatient Medications on File Prior to Visit  Medication Sig Dispense Refill  . aspirin 81 MG EC tablet Take 1 tablet (81 mg total) by mouth daily. 90 tablet 3  . nicotine (NICODERM CQ - DOSED IN MG/24 HOURS) 14 mg/24hr patch Place 1 patch (14 mg total) onto the skin daily. (Patient not taking: No sig reported) 28 patch 0  . potassium chloride SA (K-DUR,KLOR-CON) 20 MEQ tablet Take 2 tablets (40 mEq total) by mouth daily for 2 days. (Patient not taking: Reported on 08/06/2018) 4 tablet 0   No current facility-administered medications on file  prior to visit.    There are no Patient Instructions on file for this visit. No follow-ups on file.   Georgiana Spinner, NP

## 2021-06-06 ENCOUNTER — Other Ambulatory Visit (INDEPENDENT_AMBULATORY_CARE_PROVIDER_SITE_OTHER): Payer: Self-pay | Admitting: Nurse Practitioner

## 2021-06-06 DIAGNOSIS — I739 Peripheral vascular disease, unspecified: Secondary | ICD-10-CM

## 2021-06-07 ENCOUNTER — Ambulatory Visit (INDEPENDENT_AMBULATORY_CARE_PROVIDER_SITE_OTHER): Payer: Medicaid Other | Admitting: Nurse Practitioner

## 2021-06-07 ENCOUNTER — Encounter (INDEPENDENT_AMBULATORY_CARE_PROVIDER_SITE_OTHER): Payer: Medicaid Other

## 2021-07-12 ENCOUNTER — Other Ambulatory Visit (INDEPENDENT_AMBULATORY_CARE_PROVIDER_SITE_OTHER): Payer: Self-pay | Admitting: Nurse Practitioner

## 2021-07-12 DIAGNOSIS — R030 Elevated blood-pressure reading, without diagnosis of hypertension: Secondary | ICD-10-CM

## 2021-07-24 ENCOUNTER — Other Ambulatory Visit (INDEPENDENT_AMBULATORY_CARE_PROVIDER_SITE_OTHER): Payer: Self-pay | Admitting: Nurse Practitioner

## 2021-07-24 DIAGNOSIS — R030 Elevated blood-pressure reading, without diagnosis of hypertension: Secondary | ICD-10-CM

## 2021-07-28 ENCOUNTER — Other Ambulatory Visit (INDEPENDENT_AMBULATORY_CARE_PROVIDER_SITE_OTHER): Payer: Self-pay | Admitting: Nurse Practitioner

## 2021-07-28 DIAGNOSIS — R030 Elevated blood-pressure reading, without diagnosis of hypertension: Secondary | ICD-10-CM

## 2021-07-31 ENCOUNTER — Other Ambulatory Visit (INDEPENDENT_AMBULATORY_CARE_PROVIDER_SITE_OTHER): Payer: Self-pay | Admitting: Nurse Practitioner

## 2021-07-31 DIAGNOSIS — I739 Peripheral vascular disease, unspecified: Secondary | ICD-10-CM

## 2021-10-26 ENCOUNTER — Other Ambulatory Visit (INDEPENDENT_AMBULATORY_CARE_PROVIDER_SITE_OTHER): Payer: Self-pay | Admitting: Nurse Practitioner

## 2021-10-26 DIAGNOSIS — I739 Peripheral vascular disease, unspecified: Secondary | ICD-10-CM

## 2021-12-18 ENCOUNTER — Other Ambulatory Visit: Payer: Self-pay | Admitting: *Deleted

## 2021-12-18 DIAGNOSIS — F17210 Nicotine dependence, cigarettes, uncomplicated: Secondary | ICD-10-CM

## 2021-12-18 DIAGNOSIS — Z87891 Personal history of nicotine dependence: Secondary | ICD-10-CM

## 2021-12-18 DIAGNOSIS — Z122 Encounter for screening for malignant neoplasm of respiratory organs: Secondary | ICD-10-CM

## 2022-02-07 ENCOUNTER — Encounter: Payer: Self-pay | Admitting: Acute Care

## 2022-02-07 ENCOUNTER — Ambulatory Visit (INDEPENDENT_AMBULATORY_CARE_PROVIDER_SITE_OTHER): Payer: Medicaid Other | Admitting: Acute Care

## 2022-02-07 DIAGNOSIS — F17210 Nicotine dependence, cigarettes, uncomplicated: Secondary | ICD-10-CM | POA: Diagnosis not present

## 2022-02-07 NOTE — Progress Notes (Signed)
Virtual Visit via Telephone Note  I connected with Kyle Gordon on 02/07/22 at 10:00 AM EST by telephone and verified that I am speaking with the correct person using two identifiers.  Location: Patient:  At home Provider:  [place], [place], [place], [address]    I discussed the limitations, risks, security and privacy concerns of performing an evaluation and management service by telephone and the availability of in person appointments. I also discussed with the patient that there may be a patient responsible charge related to this service. The patient expressed understanding and agreed to proceed.    Shared Decision Making Visit Lung Cancer Screening Program [phone])   Eligibility: Age 62 y.o. Pack Years Smoking History Calculation 44 pack year smoking history (# packs/per year x # years smoked) Recent History of coughing up blood  no Unexplained weight loss? no ( >Than 15 pounds within the last 6 months ) Prior History Lung / other cancer no (Diagnosis within the last 5 years already requiring surveillance chest CT Scans). Smoking Status Current Smoker Former Smokers: Years since quit:  NA  Quit Date:  NA  Visit Components: Discussion included one or more decision making aids. yes Discussion included risk/benefits of screening. yes Discussion included potential follow up diagnostic testing for abnormal scans. yes Discussion included meaning and risk of over diagnosis. yes Discussion included meaning and risk of False Positives. yes Discussion included meaning of total radiation exposure. yes  Counseling Included: Importance of adherence to annual lung cancer LDCT screening. yes Impact of comorbidities on ability to participate in the program. yes Ability and willingness to under diagnostic treatment. yes  Smoking Cessation Counseling: Current Smokers:  Discussed importance of smoking cessation. yes Information about tobacco cessation classes and  interventions provided to patient. yes Patient provided with "ticket" for LDCT Scan. yes Symptomatic Patient. no  Counseling NA Diagnosis Code: Tobacco Use Z72.0 Asymptomatic Patient yes  Counseling (Intermediate counseling: > three minutes counseling) Z1245 Former Smokers:  Discussed the importance of maintaining cigarette abstinence. yes Diagnosis Code: Personal History of Nicotine Dependence. Y09.983 Information about tobacco cessation classes and interventions provided to patient. Yes Patient provided with "ticket" for LDCT Scan. yes Written Order for Lung Cancer Screening with LDCT placed in Epic. Yes (CT Chest Lung Cancer Screening Low Dose W/O CM) [id number] Z12.2-Screening of respiratory organs Z87.891-Personal history of nicotine dependence  I have spent 25 minutes of face to face/ virtual visit   time with  Kyle Gordon discussing the risks and benefits of lung cancer screening. We viewed / discussed a power point together that explained in detail the above noted topics. We paused at intervals to allow for questions to be asked and answered to ensure understanding.We discussed that the single most powerful action that he can take to decrease his risk of developing lung cancer is to quit smoking. We discussed whether or not he is ready to commit to setting a quit date. We discussed options for tools to aid in quitting smoking including nicotine replacement therapy, non-nicotine medications, support groups, Quit Smart classes, and behavior modification. We discussed that often times setting smaller, more achievable goals, such as eliminating 1 cigarette a day for a week and then 2 cigarettes a day for a week can be helpful in slowly decreasing the number of cigarettes smoked. This allows for a sense of accomplishment as well as providing a clinical benefit. I provided  him  with smoking cessation  information  with contact information for community resources,  classes, free nicotine replacement  therapy, and access to mobile apps, text messaging, and on-line smoking cessation help. I have also provided  him  the office contact information in the event he needs to contact me, or the screening staff. We discussed the time and location of the scan, and that either Doroteo Glassman RN, Joella Prince, RN  or I will call / send a letter with the results within 24-72 hours of receiving them. The patient verbalized understanding of all of  the above and had no further questions upon leaving the office. They have my contact information in the event they have any further questions.  I spent 3 minutes counseling on smoking cessation and the health risks of continued tobacco abuse.  I explained to the patient that there has been a high incidence of coronary artery disease noted on these exams. I explained that this is a non-gated exam therefore degree or severity cannot be determined. This patient is on statin therapy. I have asked the patient to follow-up with their PCP regarding any incidental finding of coronary artery disease and management with diet or medication as their PCP  feels is clinically indicated. The patient verbalized understanding of the above and had no further questions upon completion of the visit.      Magdalen Spatz, NP 02/07/2022

## 2022-02-07 NOTE — Patient Instructions (Signed)
Thank you for participating in the [place] [facility]. It was our pleasure to meet you today. We will call you with the results of your scan within the next few days. Your scan will be assigned a Lung RADS category score by the physicians reading the scans.  This Lung RADS score determines follow up scanning.  See below for description of categories, and follow up screening recommendations. We will be in touch to schedule your follow up screening annually or based on recommendations of our providers. We will fax a copy of your scan results to your Primary Care Physician, or the physician who referred you to the program, to ensure they have the results. Please call the office if you have any questions or concerns regarding your scanning experience or results.  Our office number is [phone]. Please speak with Denise Phelps, RN. , or  Denise Buckner RN, They are  our Lung Cancer Screening RN.'s If They are unavailable when you call, Please leave a message on the voice mail. We will return your call at our earliest convenience.This voice mail is monitored several times a day.  Remember, if your scan is normal, we will scan you annually as long as you continue to meet the criteria for the program. (Age 55-77, Current smoker or smoker who has quit within the last 15 years). If you are a smoker, remember, quitting is the single most powerful action that you can take to decrease your risk of lung cancer and other pulmonary, breathing related problems. We know quitting is hard, and we are here to help.  Please let us know if there is anything we can do to help you meet your goal of quitting. If you are a former smoker, congratulations. We are proud of you! Remain smoke free! Remember you can refer friends or family members through the number above.  We will screen them to make sure they meet criteria for the program. Thank you for helping us take better care of you by  participating in Lung Screening.  You can receive free nicotine replacement therapy ( patches, gum or mints) by calling 1-800-QUIT NOW. Please call so we can get you on the path to becoming  a non-smoker. I know it is hard, but you can do this!  Lung RADS Categories:  Lung RADS 1: no nodules or definitely non-concerning nodules.  Recommendation is for a repeat annual scan in 12 months.  Lung RADS 2:  nodules that are non-concerning in appearance and behavior with a very low likelihood of becoming an active cancer. Recommendation is for a repeat annual scan in 12 months.  Lung RADS 3: nodules that are probably non-concerning , includes nodules with a low likelihood of becoming an active cancer.  Recommendation is for a 6-month repeat screening scan. Often noted after an upper respiratory illness. We will be in touch to make sure you have no questions, and to schedule your 6-month scan.  Lung RADS 4 A: nodules with concerning findings, recommendation is most often for a follow up scan in 3 months or additional testing based on our provider's assessment of the scan. We will be in touch to make sure you have no questions and to schedule the recommended 3 month follow up scan.  Lung RADS 4 B:  indicates findings that are concerning. We will be in touch with you to schedule additional diagnostic testing based on our provider's  assessment of the scan.  Other options for assistance in smoking cessation (   As covered by your insurance benefits)  Hypnosis for smoking cessation  [employer] [phone]  Acupuncture for smoking cessation  [facility] [phone]   

## 2022-02-09 ENCOUNTER — Ambulatory Visit: Admission: RE | Admit: 2022-02-09 | Payer: Medicaid Other | Source: Ambulatory Visit
